# Patient Record
Sex: Female | Born: 1975 | Race: Asian | Hispanic: No | Smoking: Never smoker
Health system: Southern US, Community
[De-identification: ages and names within clinical notes are randomized; demographics above are authoritative.]

## PROBLEM LIST (undated history)

## (undated) DIAGNOSIS — K219 Gastro-esophageal reflux disease without esophagitis: Secondary | ICD-10-CM

## (undated) DIAGNOSIS — I10 Essential (primary) hypertension: Secondary | ICD-10-CM

## (undated) DIAGNOSIS — Z87442 Personal history of urinary calculi: Secondary | ICD-10-CM

## (undated) DIAGNOSIS — D649 Anemia, unspecified: Secondary | ICD-10-CM

## (undated) DIAGNOSIS — R519 Headache, unspecified: Secondary | ICD-10-CM

## (undated) DIAGNOSIS — E785 Hyperlipidemia, unspecified: Secondary | ICD-10-CM

## (undated) DIAGNOSIS — E119 Type 2 diabetes mellitus without complications: Secondary | ICD-10-CM

## (undated) DIAGNOSIS — Z973 Presence of spectacles and contact lenses: Secondary | ICD-10-CM

## (undated) DIAGNOSIS — K30 Functional dyspepsia: Secondary | ICD-10-CM

## (undated) HISTORY — DX: Functional dyspepsia: K30

## (undated) HISTORY — DX: Anemia, unspecified: D64.9

## (undated) HISTORY — DX: Essential (primary) hypertension: I10

## (undated) HISTORY — DX: Hyperlipidemia, unspecified: E78.5

## (undated) HISTORY — DX: Type 2 diabetes mellitus without complications: E11.9

---

## 2012-05-09 HISTORY — PX: BREAST BIOPSY: SHX20

## 2016-09-07 ENCOUNTER — Encounter (INDEPENDENT_AMBULATORY_CARE_PROVIDER_SITE_OTHER): Payer: Self-pay | Admitting: Physician Assistant

## 2016-09-07 ENCOUNTER — Ambulatory Visit (INDEPENDENT_AMBULATORY_CARE_PROVIDER_SITE_OTHER): Payer: Medicaid Other | Admitting: Physician Assistant

## 2016-09-07 VITALS — BP 127/76 | HR 77 | Temp 97.6°F | Ht 59.0 in | Wt 152.4 lb

## 2016-09-07 DIAGNOSIS — R202 Paresthesia of skin: Secondary | ICD-10-CM

## 2016-09-07 DIAGNOSIS — Z114 Encounter for screening for human immunodeficiency virus [HIV]: Secondary | ICD-10-CM

## 2016-09-07 DIAGNOSIS — E785 Hyperlipidemia, unspecified: Secondary | ICD-10-CM

## 2016-09-07 DIAGNOSIS — I10 Essential (primary) hypertension: Secondary | ICD-10-CM | POA: Diagnosis not present

## 2016-09-07 DIAGNOSIS — K30 Functional dyspepsia: Secondary | ICD-10-CM

## 2016-09-07 DIAGNOSIS — G4700 Insomnia, unspecified: Secondary | ICD-10-CM | POA: Diagnosis not present

## 2016-09-07 DIAGNOSIS — E119 Type 2 diabetes mellitus without complications: Secondary | ICD-10-CM

## 2016-09-07 DIAGNOSIS — R5383 Other fatigue: Secondary | ICD-10-CM | POA: Diagnosis not present

## 2016-09-07 HISTORY — DX: Functional dyspepsia: K30

## 2016-09-07 LAB — GLUCOSE, POCT (MANUAL RESULT ENTRY): POC GLUCOSE: 194 mg/dL — AB (ref 70–99)

## 2016-09-07 LAB — POCT GLYCOSYLATED HEMOGLOBIN (HGB A1C): Hemoglobin A1C: 7.5

## 2016-09-07 MED ORDER — SITAGLIPTIN PHOS-METFORMIN HCL 50-1000 MG PO TABS: 1.0000 | ORAL_TABLET | Freq: Two times a day (BID) | ORAL | 1 refills | Status: DC

## 2016-09-07 MED ORDER — VITAMIN B-12 100 MCG PO TABS: 100.0000 ug | ORAL_TABLET | Freq: Every day | ORAL | 0 refills | Status: DC

## 2016-09-07 MED ORDER — PANTOPRAZOLE SODIUM 40 MG PO TBEC: 40.0000 mg | DELAYED_RELEASE_TABLET | Freq: Every day | ORAL | 3 refills | Status: DC

## 2016-09-07 MED ORDER — VITAMIN D-3 125 MCG (5000 UT) PO TABS: 1.0000 | ORAL_TABLET | Freq: Every day | ORAL | 1 refills | Status: DC

## 2016-09-07 MED ORDER — MIRTAZAPINE 15 MG PO TABS: 15.0000 mg | ORAL_TABLET | Freq: Every day | ORAL | 0 refills | Status: DC

## 2016-09-07 NOTE — Progress Notes (Signed)
Subjective:  Patient ID: Katherine Barr, female    DOB: 1975/05/19  Age: 41 y.o. MRN: [id number]  CC: establish care  HPI Katherine Barr is a 41 y.o. female with a PMH of HTN, DM2, and hyperlipidemia presents to establish care for these conditions. Takes medications but does not remember the names or doses. Will call us when she gets home with med info. Also complains of fatigue, stress, acid indigestion, insomnia, and paresthesia of LEs at night only. Denies any other symptoms.   Review of Systems  Constitutional: Positive for malaise/fatigue. Negative for chills and fever.  Eyes: Negative for blurred vision.  Respiratory: Negative for shortness of breath.   Cardiovascular: Negative for chest pain and palpitations.  Gastrointestinal: Positive for abdominal pain. Negative for nausea.  Genitourinary: Negative for dysuria and hematuria.  Musculoskeletal: Negative for joint pain and myalgias.  Skin: Negative for rash.  Neurological: Positive for tingling. Negative for headaches.  Psychiatric/Behavioral: Negative for depression. The patient has insomnia. The patient is not nervous/anxious.        Stress    Objective:  BP 127/76 (BP Location: Left Arm, Patient Position: Sitting, Cuff Size: Normal)   Pulse 77   Temp 97.6 F (36.4 C) (Oral)   Ht  (1.499 m)   Wt 152 lb 6.4 oz (69.1 kg)   LMP 08/19/2016 (Exact Date)   SpO2 99%   BMI 30.78 kg/m   BP/Weight 09/07/2016  Systolic BP 127  Diastolic BP 76  Wt. (Lbs) 152.4  BMI 30.78      Physical Exam  Constitutional: She is oriented to person, place, and time.  Well developed, overweight, NAD, polite, assertive   HENT:  Head: Normocephalic and atraumatic.  Eyes: No scleral icterus.  Neck: Normal range of motion. Neck supple. No thyromegaly present.  Cardiovascular: Normal rate, regular rhythm and normal heart sounds.   Pulmonary/Chest: Effort normal and breath sounds normal.  Musculoskeletal: She exhibits no edema.   Neurological: She is alert and oriented to person, place, and time. No cranial nerve deficit. Coordination normal.  Skin: Skin is warm and dry. No rash noted. No erythema. No pallor.  Psychiatric: She has a normal mood and affect. Her behavior is normal. Thought content normal.  Vitals reviewed.    Assessment & Plan:   1. Fatigue, unspecified type - CBC with Differential - Comprehensive metabolic panel - TSH - Begin Cholecalciferol (VITAMIN D-3) 5000 units TABS; Take 1 tablet by mouth daily.  Dispense: 30 tablet; Refill: 1  2. Insomnia, unspecified type - Begin mirtazapine (REMERON) 15 MG tablet; Take 1 tablet (15 mg total) by mouth at bedtime.  Dispense: 30 tablet; Refill: 0  3. Type 2 diabetes mellitus without complication, without long-term current use of insulin (HCC) - Refill sitaGLIPtin-metformin (JANUMET) 50-1000 MG tablet; Take 1 tablet by mouth 2 (two) times daily with a meal.  Dispense: 180 tablet; Refill: 1 - HgB A1c 7.5% in clinic today - Glucose (CBG) 194 in clinic today - Microalbumin/Creatinine Ratio, Urine - Ambulatory referral to Ophthalmology   4. Hypertension, unspecified type - Pt will call with medication list  5. Hyperlipidemia, unspecified hyperlipidemia type - Lipid panel  6. Acid indigestion - Refill pantoprazole (PROTONIX) 40 MG tablet; Take 1 tablet (40 mg total) by mouth daily.  Dispense: 30 tablet; Refill: 3  7. Screening for HIV (human immunodeficiency virus) - HIV antibody  8. Paresthesia - Begin vitamin B-12 (CYANOCOBALAMIN) 100 MCG tablet; Take 1 tablet (100 mcg total) by mouth daily.  Dispense: 14 tablet; Refill: 0   Meds ordered this encounter  Medications  . sitaGLIPtin-metformin (JANUMET) 50-1000 MG tablet    Sig: Take 1 tablet by mouth 2 (two) times daily with a meal.    Dispense:  180 tablet    Refill:  1    Order Specific Question:   Supervising Provider    Answer:   Quentin Angst [id number]  . pantoprazole (PROTONIX)  40 MG tablet    Sig: Take 1 tablet (40 mg total) by mouth daily.    Dispense:  30 tablet    Refill:  3    Order Specific Question:   Supervising Provider    Answer:   Quentin Angst [id number]  . mirtazapine (REMERON) 15 MG tablet    Sig: Take 1 tablet (15 mg total) by mouth at bedtime.    Dispense:  30 tablet    Refill:  0    Order Specific Question:   Supervising Provider    Answer:   Quentin Angst [id number]  . vitamin B-12 (CYANOCOBALAMIN) 100 MCG tablet    Sig: Take 1 tablet (100 mcg total) by mouth daily.    Dispense:  14 tablet    Refill:  0    Order Specific Question:   Supervising Provider    Answer:   Quentin Angst [id number]  . Cholecalciferol (VITAMIN D-3) 5000 units TABS    Sig: Take 1 tablet by mouth daily.    Dispense:  30 tablet    Refill:  1    Order Specific Question:   Supervising Provider    Answer:   Quentin Angst [id number]    Follow-up: Return in about 4 weeks (around 10/05/2016) for insomnia, DM.   Loletta Specter PA

## 2016-09-07 NOTE — Patient Instructions (Addendum)
Please remember to call me with the medications that you take and their dosages.  Your A1c is 7.5% today. This is likely the highest you have had your sugar. I recommend a low carb diet and regular exercise for at least 30 minutes daily. Please read over the Diabetic Nutrition pamphlet I gave you.   Diabetes Mellitus and Food It is important for you to manage your blood sugar (glucose) level. Your blood glucose level can be greatly affected by what you eat. Eating healthier foods in the appropriate amounts throughout the day at about the same time each day will help you control your blood glucose level. It can also help slow or prevent worsening of your diabetes mellitus. Healthy eating may even help you improve the level of your blood pressure and reach or maintain a healthy weight. General recommendations for healthful eating and cooking habits include:  Eating meals and snacks regularly. Avoid going long periods of time without eating to lose weight.  Eating a diet that consists mainly of plant-based foods, such as fruits, vegetables, nuts, legumes, and whole grains.  Using low-heat cooking methods, such as baking, instead of high-heat cooking methods, such as deep frying. Work with your dietitian to make sure you understand how to use the Nutrition Facts information on food labels. How can food affect me? Carbohydrates  Carbohydrates affect your blood glucose level more than any other type of food. Your dietitian will help you determine how many carbohydrates to eat at each meal and teach you how to count carbohydrates. Counting carbohydrates is important to keep your blood glucose at a healthy level, especially if you are using insulin or taking certain medicines for diabetes mellitus. Alcohol  Alcohol can cause sudden decreases in blood glucose (hypoglycemia), especially if you use insulin or take certain medicines for diabetes mellitus. Hypoglycemia can be a life-threatening condition.  Symptoms of hypoglycemia (sleepiness, dizziness, and disorientation) are similar to symptoms of having too much alcohol. If your health care provider has given you approval to drink alcohol, do so in moderation and use the following guidelines:  Women should not have more than one drink per day, and men should not have more than two drinks per day. One drink is equal to:  12 oz of beer.  5 oz of wine.  1 oz of hard liquor.  Do not drink on an empty stomach.  Keep yourself hydrated. Have water, diet soda, or unsweetened iced tea.  Regular soda, juice, and other mixers might contain a lot of carbohydrates and should be counted. What foods are not recommended? As you make food choices, it is important to remember that all foods are not the same. Some foods have fewer nutrients per serving than other foods, even though they might have the same number of calories or carbohydrates. It is difficult to get your body what it needs when you eat foods with fewer nutrients. Examples of foods that you should avoid that are high in calories and carbohydrates but low in nutrients include:  Trans fats (most processed foods list trans fats on the Nutrition Facts label).  Regular soda.  Juice.  Candy.  Sweets, such as cake, pie, doughnuts, and cookies.  Fried foods. What foods can I eat? Eat nutrient-rich foods, which will nourish your body and keep you healthy. The food you should eat also will depend on several factors, including:  The calories you need.  The medicines you take.  Your weight.  Your blood glucose level.  Your blood  pressure level.  Your cholesterol level. You should eat a variety of foods, including:  Protein.  Lean cuts of meat.  Proteins low in saturated fats, such as fish, egg whites, and beans. Avoid processed meats.  Fruits and vegetables.  Fruits and vegetables that may help control blood glucose levels, such as apples, mangoes, and yams.  Dairy  products.  Choose fat-free or low-fat dairy products, such as milk, yogurt, and cheese.  Grains, bread, pasta, and rice.  Choose whole grain products, such as multigrain bread, whole oats, and brown rice. These foods may help control blood pressure.  Fats.  Foods containing healthful fats, such as nuts, avocado, olive oil, canola oil, and fish. Does everyone with diabetes mellitus have the same meal plan? Because every person with diabetes mellitus is different, there is not one meal plan that works for everyone. It is very important that you meet with a dietitian who will help you create a meal plan that is just right for you. This information is not intended to replace advice given to you by your health care provider. Make sure you discuss any questions you have with your health care provider. Document Released: 01/20/2005 Document Revised: 10/01/2015 Document Reviewed: 03/22/2013 Elsevier Interactive Patient Education  2017 [employer].

## 2016-09-08 LAB — CBC WITH DIFFERENTIAL/PLATELET
BASOS ABS: 0 10*3/uL (ref 0.0–0.2)
Basos: 0 %
EOS (ABSOLUTE): 0.2 10*3/uL (ref 0.0–0.4)
Eos: 2 %
Hematocrit: 28 % — ABNORMAL LOW (ref 34.0–46.6)
Hemoglobin: 8.1 g/dL — ABNORMAL LOW (ref 11.1–15.9)
IMMATURE GRANS (ABS): 0.1 10*3/uL (ref 0.0–0.1)
IMMATURE GRANULOCYTES: 1 %
LYMPHS: 33 %
Lymphocytes Absolute: 3.3 10*3/uL — ABNORMAL HIGH (ref 0.7–3.1)
MCH: 19.4 pg — AB (ref 26.6–33.0)
MCHC: 28.9 g/dL — ABNORMAL LOW (ref 31.5–35.7)
MCV: 67 fL — ABNORMAL LOW (ref 79–97)
Monocytes Absolute: 0.4 10*3/uL (ref 0.1–0.9)
Monocytes: 4 %
NEUTROS PCT: 60 %
Neutrophils Absolute: 6.1 10*3/uL (ref 1.4–7.0)
PLATELETS: 386 10*3/uL — AB (ref 150–379)
RBC: 4.17 x10E6/uL (ref 3.77–5.28)
RDW: 17.8 % — ABNORMAL HIGH (ref 12.3–15.4)
WBC: 10.1 10*3/uL (ref 3.4–10.8)

## 2016-09-08 LAB — HIV ANTIBODY (ROUTINE TESTING W REFLEX): HIV SCREEN 4TH GENERATION: NONREACTIVE

## 2016-09-08 LAB — LIPID PANEL
Chol/HDL Ratio: 3.5 ratio (ref 0.0–4.4)
Cholesterol, Total: 117 mg/dL (ref 100–199)
HDL: 33 mg/dL — ABNORMAL LOW (ref >39)
LDL Calculated: 31 mg/dL (ref 0–99)
Triglycerides: 265 mg/dL — ABNORMAL HIGH (ref 0–149)
VLDL CHOLESTEROL CAL: 53 mg/dL — AB (ref 5–40)

## 2016-09-08 LAB — COMPREHENSIVE METABOLIC PANEL
ALK PHOS: 84 IU/L (ref 39–117)
ALT: 14 IU/L (ref 0–32)
AST: 13 IU/L (ref 0–40)
Albumin/Globulin Ratio: 1.3 (ref 1.2–2.2)
Albumin: 4 g/dL (ref 3.5–5.5)
BUN / CREAT RATIO: 15 (ref 9–23)
BUN: 9 mg/dL (ref 6–24)
CALCIUM: 9.2 mg/dL (ref 8.7–10.2)
CHLORIDE: 104 mmol/L (ref 96–106)
CO2: 18 mmol/L (ref 18–29)
Creatinine, Ser: 0.59 mg/dL (ref 0.57–1.00)
GFR calc Af Amer: 133 mL/min/{1.73_m2} (ref >59)
GFR calc non Af Amer: 115 mL/min/{1.73_m2} (ref >59)
GLUCOSE: 201 mg/dL — AB (ref 65–99)
Globulin, Total: 3.1 g/dL (ref 1.5–4.5)
Potassium: 4.3 mmol/L (ref 3.5–5.2)
Sodium: 140 mmol/L (ref 134–144)
TOTAL PROTEIN: 7.1 g/dL (ref 6.0–8.5)

## 2016-09-08 LAB — TSH: TSH: 2.22 u[IU]/mL (ref 0.450–4.500)

## 2016-09-08 LAB — MICROALBUMIN / CREATININE URINE RATIO
CREATININE, UR: 41.4 mg/dL
MICROALB/CREAT RATIO: 15.2 mg/g{creat} (ref 0.0–30.0)
MICROALBUM., U, RANDOM: 6.3 ug/mL

## 2016-09-21 ENCOUNTER — Encounter (INDEPENDENT_AMBULATORY_CARE_PROVIDER_SITE_OTHER): Payer: Self-pay | Admitting: Physician Assistant

## 2016-09-21 ENCOUNTER — Ambulatory Visit (INDEPENDENT_AMBULATORY_CARE_PROVIDER_SITE_OTHER): Payer: Medicaid Other | Admitting: Physician Assistant

## 2016-09-21 VITALS — BP 128/82 | HR 83 | Temp 97.7°F | Wt 152.4 lb

## 2016-09-21 DIAGNOSIS — E781 Pure hyperglyceridemia: Secondary | ICD-10-CM

## 2016-09-21 DIAGNOSIS — D649 Anemia, unspecified: Secondary | ICD-10-CM

## 2016-09-21 DIAGNOSIS — R059 Cough, unspecified: Secondary | ICD-10-CM

## 2016-09-21 DIAGNOSIS — R05 Cough: Secondary | ICD-10-CM

## 2016-09-21 MED ORDER — FERROUS SULFATE 325 (65 FE) MG PO TABS
325.0000 mg | ORAL_TABLET | Freq: Every day | ORAL | 1 refills | Status: DC
Start: 2016-09-21 — End: 2017-11-26

## 2016-09-21 MED ORDER — MEGARED OMEGA-3 KRILL OIL 500 MG PO CAPS: 1.0000 | ORAL_CAPSULE | Freq: Every day | ORAL | 5 refills | Status: AC

## 2016-09-21 MED ORDER — LORATADINE 10 MG PO TABS
10.0000 mg | ORAL_TABLET | Freq: Every day | ORAL | 11 refills | Status: DC
Start: 2016-09-21 — End: 2017-11-26

## 2016-09-21 MED ORDER — DEXTROMETHORPHAN POLISTIREX ER 30 MG/5ML PO SUER: 60.0000 mg | Freq: Two times a day (BID) | ORAL | 0 refills | Status: DC

## 2016-09-21 NOTE — Patient Instructions (Signed)
Anemia, Nonspecific Anemia is a condition in which the concentration of red blood cells or hemoglobin in the blood is below normal. Hemoglobin is a substance in red blood cells that carries oxygen to the tissues of the body. Anemia results in not enough oxygen reaching these tissues. What are the causes? Common causes of anemia include:  Excessive bleeding. Bleeding may be internal or external. This includes excessive bleeding from periods (in women) or from the intestine.  Poor nutrition.  Chronic kidney, thyroid, and liver disease.  Bone marrow disorders that decrease red blood cell production.  Cancer and treatments for cancer.  HIV, AIDS, and their treatments.  Spleen problems that increase red blood cell destruction.  Blood disorders.  Excess destruction of red blood cells due to infection, medicines, and autoimmune disorders. What are the signs or symptoms?  Minor weakness.  Dizziness.  Headache.  Palpitations.  Shortness of breath, especially with exercise.  Paleness.  Cold sensitivity.  Indigestion.  Nausea.  Difficulty sleeping.  Difficulty concentrating. Symptoms may occur suddenly or they may develop slowly. How is this diagnosed? Additional blood tests are often needed. These help your health care provider determine the best treatment. Your health care provider will check your stool for blood and look for other causes of blood loss. How is this treated? Treatment varies depending on the cause of the anemia. Treatment can include:  Supplements of iron, vitamin B12, or folic acid.  Hormone medicines.  A blood transfusion. This may be needed if blood loss is severe.  Hospitalization. This may be needed if there is significant continual blood loss.  Dietary changes.  Spleen removal. Follow these instructions at home: Keep all follow-up appointments. It often takes many weeks to correct anemia, and having your health care provider check on your  condition and your response to treatment is very important. Get help right away if:  You develop extreme weakness, shortness of breath, or chest pain.  You become dizzy or have trouble concentrating.  You develop heavy vaginal bleeding.  You develop a rash.  You have bloody or black, tarry stools.  You faint.  You vomit up blood.  You vomit repeatedly.  You have abdominal pain.  You have a fever or persistent symptoms for more than 2-3 days.  You have a fever and your symptoms suddenly get worse.  You are dehydrated. This information is not intended to replace advice given to you by your health care provider. Make sure you discuss any questions you have with your health care provider. Document Released: 06/02/2004 Document Revised: 10/07/2015 Document Reviewed: 10/19/2012 Elsevier Interactive Patient Education  2017 [employer] Inc[employer]  

## 2016-09-21 NOTE — Progress Notes (Signed)
Subjective:  Patient ID: Katherine Barr, female    DOB: 1975/08/14  Age: 41 y.o. MRN: [id number]  CC: cough  HPI Katherine Barr is a 41 y.o. female with multiple comorbidities presents to f/u on labs and has a complaint of cough. Cough appears every year in May and lasts approximately one month. Wants a cough suppressant. Denies having seasonal allergies. Labs indicated anemia and hypertriglyceridemia. Pt endorses fatigue. Does not endorse any other symptoms.     Outpatient Medications Prior to Visit  Medication Sig Dispense Refill  . Cholecalciferol (VITAMIN D-3) 5000 units TABS Take 1 tablet by mouth daily. 30 tablet 1  . mirtazapine (REMERON) 15 MG tablet Take 1 tablet (15 mg total) by mouth at bedtime. 30 tablet 0  . pantoprazole (PROTONIX) 40 MG tablet Take 1 tablet (40 mg total) by mouth daily. 30 tablet 3  . sitaGLIPtin-metformin (JANUMET) 50-1000 MG tablet Take 1 tablet by mouth 2 (two) times daily with a meal. 180 tablet 1  . vitamin B-12 (CYANOCOBALAMIN) 100 MCG tablet Take 1 tablet (100 mcg total) by mouth daily. 14 tablet 0   No facility-administered medications prior to visit.      ROS Review of Systems  Constitutional: Positive for malaise/fatigue. Negative for chills and fever.  Eyes: Negative for blurred vision.  Respiratory: Negative for shortness of breath.   Cardiovascular: Negative for chest pain and palpitations.  Gastrointestinal: Negative for abdominal pain and nausea.  Genitourinary: Negative for dysuria and hematuria.  Musculoskeletal: Negative for joint pain and myalgias.  Skin: Negative for rash.  Neurological: Negative for tingling and headaches.  Psychiatric/Behavioral: Negative for depression. The patient is not nervous/anxious.     Objective:  BP 128/82 (BP Location: Left Arm, Patient Position: Sitting, Cuff Size: Normal)   Pulse 83   Temp 97.7 F (36.5 C) (Oral)   Wt 152 lb 6.4 oz (69.1 kg)   LMP 09/15/2016 (Exact Date)   SpO2 97%    BMI 30.78 kg/m   BP/Weight 09/21/2016 09/07/2016  Systolic BP 128 127  Diastolic BP 82 76  Wt. (Lbs) 152.4 152.4  BMI 30.78 30.78      Physical Exam  Constitutional: She is oriented to person, place, and time.  Well developed, overweight, NAD, polite  HENT:  Head: Normocephalic and atraumatic.  Eyes: No scleral icterus.  Cardiovascular: Normal rate, regular rhythm and normal heart sounds.   Pulmonary/Chest: Effort normal and breath sounds normal.  Neurological: She is alert and oriented to person, place, and time.  Skin: Skin is warm and dry. No rash noted. No erythema. No pallor.  Psychiatric: She has a normal mood and affect. Her behavior is normal. Thought content normal.  Vitals reviewed.    Assessment & Plan:   1. Anemia, unspecified type - Iron and TIBC - Ferritin - Hemoglobinopathy evaluation - Begin ferrous sulfate 325 (65 FE) MG tablet; Take 1 tablet (325 mg total) by mouth daily with breakfast.  Dispense: 60 tablet; Refill: 1  2. Hypertriglyceridemia - Begin MEGARED OMEGA-3 KRILL OIL 500 MG CAPS; Take 1 capsule by mouth daily.  Dispense: 30 capsule; Refill: 5  3. Cough - Begin loratadine (CLARITIN) 10 MG tablet; Take 1 tablet (10 mg total) by mouth daily.  Dispense: 30 tablet; Refill: 11 - Begin dextromethorphan (DELSYM) 30 MG/5ML liquid; Take 10 mLs (60 mg total) by mouth 2 (two) times daily.  Dispense: 148 mL; Refill: 0 - TB Skin Test   Meds ordered this encounter  Medications  . MEGARED OMEGA-3  KRILL OIL 500 MG CAPS    Sig: Take 1 capsule by mouth daily.    Dispense:  30 capsule    Refill:  5    Order Specific Question:   Supervising Provider    Answer:   Quentin AngstJEGEDE, OLUGBEMIGA E [id number]]  . ferrous sulfate 325 (65 FE) MG tablet    Sig: Take 1 tablet (325 mg total) by mouth daily with breakfast.    Dispense:  60 tablet    Refill:  1    Order Specific Question:   Supervising Provider    Answer:   Quentin AngstJEGEDE, OLUGBEMIGA E [id number]  . loratadine (CLARITIN)  10 MG tablet    Sig: Take 1 tablet (10 mg total) by mouth daily.    Dispense:  30 tablet    Refill:  11    Order Specific Question:   Supervising Provider    Answer:   Quentin AngstJEGEDE, OLUGBEMIGA E [id number]  . dextromethorphan (DELSYM) 30 MG/5ML liquid    Sig: Take 10 mLs (60 mg total) by mouth 2 (two) times daily.    Dispense:  148 mL    Refill:  0    Order Specific Question:   Supervising Provider    Answer:   Quentin AngstJEGEDE, OLUGBEMIGA E [[id number]]    Follow-up: Return in about 4 weeks (around 10/19/2016), or if symptoms worsen or fail to improve, for cough, anemia.   Loletta Specteroger David Markeda Narvaez PA

## 2016-09-22 ENCOUNTER — Other Ambulatory Visit (INDEPENDENT_AMBULATORY_CARE_PROVIDER_SITE_OTHER): Payer: Self-pay | Admitting: Physician Assistant

## 2016-09-22 DIAGNOSIS — E119 Type 2 diabetes mellitus without complications: Secondary | ICD-10-CM

## 2016-09-22 LAB — HEMOGLOBINOPATHY EVALUATION
HGB A: 98.3 % (ref 96.4–98.8)
HGB C: 0 %
HGB S: 0 %
HGB VARIANT: 0 %
Hemoglobin A2 Quantitation: 1.7 % — ABNORMAL LOW (ref 1.8–3.2)
Hemoglobin F Quantitation: 0 % (ref 0.0–2.0)

## 2016-09-22 LAB — IRON AND TIBC
IRON SATURATION: 3 % — AB (ref 15–55)
IRON: 14 ug/dL — AB (ref 27–159)
Total Iron Binding Capacity: 423 ug/dL (ref 250–450)
UIBC: 409 ug/dL (ref 131–425)

## 2016-09-22 LAB — FERRITIN: FERRITIN: 6 ng/mL — AB (ref 15–150)

## 2016-09-22 MED ORDER — SITAGLIPTIN PHOS-METFORMIN HCL 50-1000 MG PO TABS: 1.0000 | ORAL_TABLET | Freq: Two times a day (BID) | ORAL | 11 refills | Status: DC

## 2016-09-22 NOTE — Progress Notes (Signed)
I called and obtained PA for Janumet.

## 2016-09-23 ENCOUNTER — Telehealth (INDEPENDENT_AMBULATORY_CARE_PROVIDER_SITE_OTHER): Payer: Self-pay | Admitting: Physician Assistant

## 2016-09-23 NOTE — Telephone Encounter (Signed)
Patient called left voicemail stated [facility]  Per patient she already discussed referral with Sindy Messingoger Gomez PA  Please follow up with patient.

## 2016-09-23 NOTE — Telephone Encounter (Signed)
FWD to PCP. Apryl Brymer S Marit Goodwill, CMA  

## 2016-09-23 NOTE — Telephone Encounter (Signed)
Fwd to pcp. Tempestt S Roberts, CMA   

## 2016-09-23 NOTE — Telephone Encounter (Signed)
[facility] called stated checking on status of PA for medication Janumet  Please call pharm back

## 2016-09-24 NOTE — Telephone Encounter (Signed)
Ambulatory referral to ophthalmology has already been done on 09/07/16. Patient has never told me where she wants to go. Please see if the referral department can accommodate her request.

## 2016-09-24 NOTE — Telephone Encounter (Signed)
I called pharmacy. I told them that I have already done the PA one day prior. Pharmacist looked and said they had two orders and it seems patient has picked up her Janumet yesterday.

## 2016-09-29 ENCOUNTER — Telehealth (INDEPENDENT_AMBULATORY_CARE_PROVIDER_SITE_OTHER): Payer: Self-pay | Admitting: Physician Assistant

## 2016-09-29 NOTE — Telephone Encounter (Signed)
Fwd to PCP. Deran Barro S Dontea Corlew, CMA  

## 2016-09-29 NOTE — Telephone Encounter (Signed)
Per patient did not get lipitor 10mg   And Propranolol 80 mg. [id number] [place] [address] [facility] [address] DEA #:  [id number]

## 2016-09-30 ENCOUNTER — Other Ambulatory Visit (INDEPENDENT_AMBULATORY_CARE_PROVIDER_SITE_OTHER): Payer: Self-pay | Admitting: Physician Assistant

## 2016-09-30 DIAGNOSIS — Z760 Encounter for issue of repeat prescription: Secondary | ICD-10-CM

## 2016-09-30 MED ORDER — ATORVASTATIN CALCIUM 10 MG PO TABS: 10.0000 mg | ORAL_TABLET | Freq: Every day | ORAL | 1 refills | Status: DC

## 2016-09-30 MED ORDER — PROPRANOLOL HCL 80 MG PO TABS: 80.0000 mg | ORAL_TABLET | Freq: Three times a day (TID) | ORAL | 1 refills | Status: DC

## 2016-09-30 NOTE — Progress Notes (Signed)
Refill request

## 2016-10-05 ENCOUNTER — Ambulatory Visit (INDEPENDENT_AMBULATORY_CARE_PROVIDER_SITE_OTHER): Payer: Medicaid Other | Admitting: Physician Assistant

## 2016-10-24 ENCOUNTER — Encounter (INDEPENDENT_AMBULATORY_CARE_PROVIDER_SITE_OTHER): Payer: Self-pay | Admitting: Physician Assistant

## 2016-10-24 ENCOUNTER — Ambulatory Visit (INDEPENDENT_AMBULATORY_CARE_PROVIDER_SITE_OTHER): Payer: Medicaid Other | Admitting: Physician Assistant

## 2016-10-24 VITALS — BP 131/81 | HR 78 | Temp 98.2°F | Wt 153.6 lb

## 2016-10-24 DIAGNOSIS — R202 Paresthesia of skin: Secondary | ICD-10-CM

## 2016-10-24 DIAGNOSIS — D649 Anemia, unspecified: Secondary | ICD-10-CM | POA: Diagnosis not present

## 2016-10-24 DIAGNOSIS — G4700 Insomnia, unspecified: Secondary | ICD-10-CM | POA: Diagnosis not present

## 2016-10-24 DIAGNOSIS — E119 Type 2 diabetes mellitus without complications: Secondary | ICD-10-CM

## 2016-10-24 HISTORY — DX: Anemia, unspecified: D64.9

## 2016-10-24 MED ORDER — MIRTAZAPINE 15 MG PO TABS: 7.5000 mg | ORAL_TABLET | Freq: Every day | ORAL | 3 refills | Status: DC

## 2016-10-24 NOTE — Progress Notes (Signed)
1. Fatigue, unspecified type - CBC with Differential - Comprehensive metabolic panel - TSH - Begin Cholecalciferol (VITAMIN D-3) 5000 units TABS; Take 1 tablet by mouth daily.  Dispense: 30 tablet; Refill: 1  2. Insomnia, unspecified type - Begin mirtazapine (REMERON) 15 MG tablet; Take 1 tablet (15 mg total) by mouth at bedtime.  Dispense: 30 tablet; Refill: 0    Subjective:  Patient ID: Katherine Barr, female    DOB: 1975/09/13  Age: 41 y.o. MRN: [id number]  CC: f/u insomnia  HPI Katherine SmokerSejalbahan N Napoli is a 41 y.o. female with a PMH of anemia, DM2, HTN, and insomnia presents to f/u for DM2 and insomnia. Says mirtazapine 15mg  has helped her sleep. Sleeps up to 10:00 am when she takes mirtazapine. No side effects reported.    In regards to DM2, she feels well. Does not take her glucose readings daily because she has lost her glucometer. She would like to try an find it before having to buy another one. Last A1c 7.5% on 09/07/16.     Outpatient Medications Prior to Visit  Medication Sig Dispense Refill  . atorvastatin (LIPITOR) 10 MG tablet Take 1 tablet (10 mg total) by mouth daily. 90 tablet 1  . Cholecalciferol (VITAMIN D-3) 5000 units TABS Take 1 tablet by mouth daily. 30 tablet 1  . dextromethorphan (DELSYM) 30 MG/5ML liquid Take 10 mLs (60 mg total) by mouth 2 (two) times daily. 148 mL 0  . ferrous sulfate 325 (65 FE) MG tablet Take 1 tablet (325 mg total) by mouth daily with breakfast. 60 tablet 1  . loratadine (CLARITIN) 10 MG tablet Take 1 tablet (10 mg total) by mouth daily. 30 tablet 11  . MEGARED OMEGA-3 KRILL OIL 500 MG CAPS Take 1 capsule by mouth daily. 30 capsule 5  . montelukast (SINGULAIR) 10 MG tablet Take 10 mg by mouth at bedtime.    . pantoprazole (PROTONIX) 40 MG tablet Take 1 tablet (40 mg total) by mouth daily. 30 tablet 3  . propranolol (INDERAL) 80 MG tablet Take 1 tablet (80 mg total) by mouth 3 (three) times daily. 270 tablet 1  . sitaGLIPtin-metformin  (JANUMET) 50-1000 MG tablet Take 1 tablet by mouth 2 (two) times daily with a meal. 60 tablet 11  . vitamin B-12 (CYANOCOBALAMIN) 100 MCG tablet Take 1 tablet (100 mcg total) by mouth daily. 14 tablet 0  . mirtazapine (REMERON) 15 MG tablet Take 1 tablet (15 mg total) by mouth at bedtime. 30 tablet 0   No facility-administered medications prior to visit.      ROS ROS  Objective:  BP 131/81 (BP Location: Left Arm, Patient Position: Sitting, Cuff Size: Normal)   Pulse 78   Temp 98.2 F (36.8 C) (Oral)   Wt 153 lb 9.6 oz (69.7 kg)   LMP 09/21/2016 (Approximate)   SpO2 98%   BMI 31.02 kg/m   BP/Weight 10/24/2016 09/21/2016 09/07/2016  Systolic BP 131 128 127  Diastolic BP 81 82 76  Wt. (Lbs) 153.6 152.4 152.4  BMI 31.02 30.78 30.78      Physical Exam   Assessment & Plan:   1. Insomnia, unspecified type - Decrease mirtazapine (REMERON) 15 MG tablet; Take 0.5 tablets (7.5 mg total) by mouth at bedtime.  Dispense: 45 tablet; Refill: 3  2. Anemia, unspecified type - CBC with Differential  3. Type 2 diabetes mellitus without complication, without long-term current use of insulin (HCC) - PR DIABETIC CUSTOM MOLDED SHOE - Referral to orthotics - Referral to  podiatry - We have faxed the referral order to [facility] so that patient can have her diabetic retinopathy screening done. - Last A1c 7.5% on 09/07/16.  4. Paresthesia - Resolved with use of Vit B12. Perhaps also with glucose control but glucometer readings are not available.   Meds ordered this encounter  Medications  . mirtazapine (REMERON) 15 MG tablet    Sig: Take 0.5 tablets (7.5 mg total) by mouth at bedtime.    Dispense:  45 tablet    Refill:  3    Order Specific Question:   Supervising Provider    Answer:   Quentin Angst [id number]    Follow-up: Return in about 8 weeks (around 12/19/2016) for DM2 for A1c.Loletta Specter PA

## 2016-10-24 NOTE — Patient Instructions (Signed)
Iron Deficiency Anemia, Adult Iron deficiency anemia is a condition in which the concentration of red blood cells or hemoglobin in the blood is below normal because of too little iron. Hemoglobin is a substance in red blood cells that carries oxygen to the body's tissues. When the concentration of red blood cells or hemoglobin is too low, not enough oxygen reaches these tissues. Iron deficiency anemia is usually long-lasting (chronic) and it develops over time. It may or may not cause symptoms. It is a common type of anemia. What are the causes? This condition may be caused by:  Not enough iron in the diet.  Blood loss caused by bleeding in the intestine.  Blood loss from a gastrointestinal condition like Crohn disease.  Frequent blood draws, such as from blood donation.  Abnormal absorption in the gut.  Heavy menstrual periods in women.  Cancers of the gastrointestinal system, such as colon cancer.  What are the signs or symptoms? Symptoms of this condition may include:  Fatigue.  Headache.  Pale skin, lips, and nail beds.  Poor appetite.  Weakness.  Shortness of breath.  Dizziness.  Cold hands and feet.  Fast or irregular heartbeat.  Irritability. This is more common in severe anemia.  Rapid breathing. This is more common in severe anemia.  Mild anemia may not cause any symptoms. How is this diagnosed? This condition is diagnosed based on:  Your medical history.  A physical exam.  Blood tests.  You may have additional tests to find the underlying cause of your anemia, such as:  Testing for blood in the stool (fecal occult blood test).  A procedure to see inside your colon and rectum (colonoscopy).  A procedure to see inside your esophagus and stomach (endoscopy).  A test in which cells are removed from bone marrow (bone marrow aspiration) or fluid is removed from the bone marrow to be examined (biopsy). This is rarely needed.  How is this  treated? This condition is treated by correcting the cause of your iron deficiency. Treatment may involve:  Adding iron-rich foods to your diet.  Taking iron supplements. If you are pregnant or breastfeeding, you may need to take extra iron because your normal diet usually does not provide the amount of iron that you need.  Increasing vitamin C intake. Vitamin C helps your body absorb iron. Your health care provider may recommend that you take iron supplements along with a glass of orange juice or a vitamin C supplement.  Medicines to make heavy menstrual flow lighter.  Surgery.  You may need repeat blood tests to determine whether treatment is working. Depending on the underlying cause, the anemia should be corrected within 2 months of starting treatment. If the treatment does not seem to be working, you may need more testing. Follow these instructions at home: Medicines  Take over-the-counter and prescription medicines only as told by your health care provider. This includes iron supplements and vitamins.  If you cannot tolerate taking iron supplements by mouth, talk with your health care provider about taking them through a vein (intravenously) or an injection into a muscle.  For the best iron absorption, you should take iron supplements when your stomach is empty. If you cannot tolerate them on an empty stomach, you may need to take them with food.  Do not drink milk or take antacids at the same time as your iron supplements. Milk and antacids may interfere with iron absorption.  Iron supplements can cause constipation. To prevent constipation, include fiber   in your diet as told by your health care provider. A stool softener may also be recommended. Eating and drinking  Talk with your health care provider before changing your diet. He or she may recommend that you eat foods that contain a lot of iron, such as: ? Liver. ? Low-fat (lean) beef. ? Breads and cereals that have iron  added to them (are fortified). ? Eggs. ? Dried fruit. ? Dark green, leafy vegetables.  To help your body use the iron from iron-rich foods, eat those foods at the same time as fresh fruits and vegetables that are high in vitamin C. Foods that are high in vitamin C include: ? Oranges. ? Peppers. ? Tomatoes. ? Mangoes.  Drinkenoughfluid to keep your urine clear or pale yellow. General instructions  Return to your normal activities as told by your health care provider. Ask your health care provider what activities are safe for you.  Practice good hygiene. Anemia can make you more prone to illness and infection.  Keep all follow-up visits as told by your health care provider. This is important. Contact a health care provider if:  You feel nauseous or you vomit.  You feel weak.  You have unexplained sweating.  You develop symptoms of constipation, such as: ? Having fewer than three bowel movements a week. ? Straining to have a bowel movement. ? Having stools that are hard, dry, or larger than normal. ? Feeling full or bloated. ? Pain in the lower abdomen. ? Not feeling relief after having a bowel movement. Get help right away if:  You faint. If this happens, do not drive yourself to the hospital. Call your local emergency services (911 in the [place]  You have chest pain.  You have shortness of breath that: ? Is severe. ? Gets worse with physical activity.  You have a rapid heartbeat.  You become light-headed when getting up from a sitting or lying down position. This information is not intended to replace advice given to you by your health care provider. Make sure you discuss any questions you have with your health care provider. Document Released: 04/22/2000 Document Revised: 01/13/2016 Document Reviewed: 01/13/2016 Elsevier Interactive Patient Education  2018 [employer]  

## 2016-10-25 LAB — CBC WITH DIFFERENTIAL/PLATELET
BASOS: 0 %
Basophils Absolute: 0 10*3/uL (ref 0.0–0.2)
EOS (ABSOLUTE): 0.2 10*3/uL (ref 0.0–0.4)
EOS: 3 %
Hematocrit: 30.3 % — ABNORMAL LOW (ref 34.0–46.6)
Hemoglobin: 9.3 g/dL — ABNORMAL LOW (ref 11.1–15.9)
IMMATURE GRANS (ABS): 0 10*3/uL (ref 0.0–0.1)
IMMATURE GRANULOCYTES: 0 %
LYMPHS: 32 %
Lymphocytes Absolute: 2.9 10*3/uL (ref 0.7–3.1)
MCH: 21.5 pg — ABNORMAL LOW (ref 26.6–33.0)
MCHC: 30.7 g/dL — ABNORMAL LOW (ref 31.5–35.7)
MCV: 70 fL — ABNORMAL LOW (ref 79–97)
MONOCYTES: 4 %
Monocytes Absolute: 0.4 10*3/uL (ref 0.1–0.9)
NEUTROS PCT: 61 %
Neutrophils Absolute: 5.6 10*3/uL (ref 1.4–7.0)
Platelets: 342 10*3/uL (ref 150–379)
RBC: 4.32 x10E6/uL (ref 3.77–5.28)
RDW: 22.7 % — ABNORMAL HIGH (ref 12.3–15.4)
WBC: 9.2 10*3/uL (ref 3.4–10.8)

## 2016-11-17 ENCOUNTER — Encounter (INDEPENDENT_AMBULATORY_CARE_PROVIDER_SITE_OTHER): Payer: Self-pay | Admitting: Physician Assistant

## 2016-11-17 ENCOUNTER — Ambulatory Visit (INDEPENDENT_AMBULATORY_CARE_PROVIDER_SITE_OTHER): Payer: Medicaid Other | Admitting: Physician Assistant

## 2016-11-17 VITALS — BP 178/103 | HR 65 | Temp 97.7°F | Wt 149.8 lb

## 2016-11-17 DIAGNOSIS — J029 Acute pharyngitis, unspecified: Secondary | ICD-10-CM

## 2016-11-17 DIAGNOSIS — I10 Essential (primary) hypertension: Secondary | ICD-10-CM

## 2016-11-17 MED ORDER — AMOXICILLIN-POT CLAVULANATE 875-125 MG PO TABS: 1.0000 | ORAL_TABLET | Freq: Two times a day (BID) | ORAL | 0 refills | Status: AC

## 2016-11-17 MED ORDER — NAPROXEN 500 MG PO TABS: 500.0000 mg | ORAL_TABLET | Freq: Two times a day (BID) | ORAL | 0 refills | Status: DC

## 2016-11-17 NOTE — Progress Notes (Signed)
Subjective:  Patient ID: Katherine Barr, female    DOB: March 29, 1976  Age: 41 y.o. MRN: [id number]  CC: sore throat  HPI Katherine Barr is a 41 y.o. female with a PMH of HTN, HLD, DM2, anemia, and GERD presents with sore throat. Onset of sore throat since approximately 5 days ago. She has been in the hospital with her mother in law which ultimately died of renal and respiratory failure. Does not endorse odynophagia, drooling, difficulty with speech, swelling, f/c/n/v, headache, nuchal rigidity, facial pain, chest pain, SOB, or rash.    Outpatient Medications Prior to Visit  Medication Sig Dispense Refill  . atorvastatin (LIPITOR) 10 MG tablet Take 1 tablet (10 mg total) by mouth daily. 90 tablet 1  . Cholecalciferol (VITAMIN D-3) 5000 units TABS Take 1 tablet by mouth daily. 30 tablet 1  . dextromethorphan (DELSYM) 30 MG/5ML liquid Take 10 mLs (60 mg total) by mouth 2 (two) times daily. 148 mL 0  . ferrous sulfate 325 (65 FE) MG tablet Take 1 tablet (325 mg total) by mouth daily with breakfast. 60 tablet 1  . loratadine (CLARITIN) 10 MG tablet Take 1 tablet (10 mg total) by mouth daily. 30 tablet 11  . MEGARED OMEGA-3 KRILL OIL 500 MG CAPS Take 1 capsule by mouth daily. 30 capsule 5  . mirtazapine (REMERON) 15 MG tablet Take 0.5 tablets (7.5 mg total) by mouth at bedtime. 45 tablet 3  . montelukast (SINGULAIR) 10 MG tablet Take 10 mg by mouth at bedtime.    . pantoprazole (PROTONIX) 40 MG tablet Take 1 tablet (40 mg total) by mouth daily. 30 tablet 3  . propranolol (INDERAL) 80 MG tablet Take 1 tablet (80 mg total) by mouth 3 (three) times daily. 270 tablet 1  . sitaGLIPtin-metformin (JANUMET) 50-1000 MG tablet Take 1 tablet by mouth 2 (two) times daily with a meal. 60 tablet 11  . vitamin B-12 (CYANOCOBALAMIN) 100 MCG tablet Take 1 tablet (100 mcg total) by mouth daily. 14 tablet 0   No facility-administered medications prior to visit.      ROS Review of Systems  Constitutional:  Negative for chills, fever and malaise/fatigue.  HENT: Positive for sore throat. Negative for congestion, ear pain and sinus pain.   Eyes: Negative for blurred vision and pain.  Respiratory: Negative for shortness of breath.   Cardiovascular: Negative for chest pain and palpitations.  Gastrointestinal: Negative for abdominal pain and nausea.  Genitourinary: Negative for dysuria and hematuria.  Musculoskeletal: Negative for joint pain and myalgias.  Skin: Negative for rash.  Neurological: Negative for tingling and headaches.  Psychiatric/Behavioral: Negative for depression. The patient is not nervous/anxious.     Objective:  BP (!) 178/103 (BP Location: Left Arm, Patient Position: Sitting, Cuff Size: Normal)   Pulse 65   Temp 97.7 F (36.5 C) (Oral)   Wt 149 lb 12.8 oz (67.9 kg)   LMP 11/02/2016 (Approximate)   SpO2 100%   BMI 30.26 kg/m   BP/Weight 11/17/2016 10/24/2016 09/21/2016  Systolic BP 178 131 128  Diastolic BP 103 81 82  Wt. (Lbs) 149.8 153.6 152.4  BMI 30.26 31.02 30.78      Physical Exam  Constitutional: She is oriented to person, place, and time.  Well developed, well nourished, NAD, polite  HENT:  Head: Normocephalic and atraumatic.  No maxillary or frontal sinus TTP. Oropharynx mild to moderately erythematous without exudative lesions. Tonsils 1+.  Eyes: Conjunctivae are normal. No scleral icterus.  Neck: Normal range of motion.  Neck supple. No thyromegaly present.  Cardiovascular: Normal rate, regular rhythm and normal heart sounds.   Pulmonary/Chest: Effort normal and breath sounds normal.  Abdominal: Soft. Bowel sounds are normal. There is no tenderness.  Musculoskeletal: She exhibits no edema.  Lymphadenopathy:    She has no cervical adenopathy.  Neurological: She is alert and oriented to person, place, and time. No cranial nerve deficit. Coordination normal.  Skin: Skin is warm and dry. No rash noted. No erythema. No pallor.  Psychiatric: She has a  normal mood and affect. Her behavior is normal. Thought content normal.  Vitals reviewed.    Assessment & Plan:   1. Hypertension, unspecified type - Recommence taking anti-hypertensives. Patient has not taken anti-hypertensives for two days. Reports good control when she is taking her anti-hypertensives.  2. Acute pharyngitis, unspecified etiology - Begin amoxicillin-clavulanate (AUGMENTIN) 875-125 MG tablet; Take 1 tablet by mouth 2 (two) times daily.  Dispense: 20 tablet; Refill: 0 - Begin naproxen (NAPROSYN) 500 MG tablet; Take 1 tablet (500 mg total) by mouth 2 (two) times daily with a meal.  Dispense: 30 tablet; Refill: 0   Meds ordered this encounter  Medications  . amoxicillin-clavulanate (AUGMENTIN) 875-125 MG tablet    Sig: Take 1 tablet by mouth 2 (two) times daily.    Dispense:  20 tablet    Refill:  0    Order Specific Question:   Supervising Provider    Answer:   Quentin Angst [id number]  . naproxen (NAPROSYN) 500 MG tablet    Sig: Take 1 tablet (500 mg total) by mouth 2 (two) times daily with a meal.    Dispense:  30 tablet    Refill:  0    Order Specific Question:   Supervising Provider    Answer:   Quentin Angst [id number]    Follow-up: Return in about 4 weeks (around 12/15/2016) for insomnia f/u.   Loletta Specter PA

## 2016-11-17 NOTE — Patient Instructions (Signed)

## 2016-12-09 ENCOUNTER — Other Ambulatory Visit (INDEPENDENT_AMBULATORY_CARE_PROVIDER_SITE_OTHER): Payer: Self-pay | Admitting: Physician Assistant

## 2016-12-09 DIAGNOSIS — Z760 Encounter for issue of repeat prescription: Secondary | ICD-10-CM

## 2016-12-09 MED ORDER — NAPROXEN 500 MG PO TABS: 500.0000 mg | ORAL_TABLET | Freq: Two times a day (BID) | ORAL | 0 refills | Status: DC

## 2017-03-27 ENCOUNTER — Ambulatory Visit (INDEPENDENT_AMBULATORY_CARE_PROVIDER_SITE_OTHER): Payer: Medicaid Other | Admitting: Physician Assistant

## 2017-03-27 ENCOUNTER — Other Ambulatory Visit: Payer: Self-pay

## 2017-03-27 ENCOUNTER — Encounter (INDEPENDENT_AMBULATORY_CARE_PROVIDER_SITE_OTHER): Payer: Self-pay | Admitting: Physician Assistant

## 2017-03-27 VITALS — BP 144/84 | HR 67 | Temp 97.9°F | Wt 145.4 lb

## 2017-03-27 DIAGNOSIS — R300 Dysuria: Secondary | ICD-10-CM | POA: Diagnosis not present

## 2017-03-27 DIAGNOSIS — D509 Iron deficiency anemia, unspecified: Secondary | ICD-10-CM

## 2017-03-27 DIAGNOSIS — E119 Type 2 diabetes mellitus without complications: Secondary | ICD-10-CM | POA: Diagnosis not present

## 2017-03-27 DIAGNOSIS — E781 Pure hyperglyceridemia: Secondary | ICD-10-CM | POA: Diagnosis not present

## 2017-03-27 DIAGNOSIS — N3000 Acute cystitis without hematuria: Secondary | ICD-10-CM | POA: Diagnosis not present

## 2017-03-27 LAB — POCT URINALYSIS DIPSTICK
BILIRUBIN UA: NEGATIVE
GLUCOSE UA: NEGATIVE
Ketones, UA: NEGATIVE
Nitrite, UA: NEGATIVE
Protein, UA: NEGATIVE
SPEC GRAV UA: 1.015 (ref 1.010–1.025)
Urobilinogen, UA: 0.2 E.U./dL
pH, UA: 5.5 (ref 5.0–8.0)

## 2017-03-27 LAB — POCT GLYCOSYLATED HEMOGLOBIN (HGB A1C): Hemoglobin A1C: 7.7

## 2017-03-27 MED ORDER — GLIMEPIRIDE 2 MG PO TABS: ORAL_TABLET | ORAL | 11 refills | Status: DC

## 2017-03-27 MED ORDER — CIPROFLOXACIN HCL 500 MG PO TABS: 500.0000 mg | ORAL_TABLET | Freq: Two times a day (BID) | ORAL | 0 refills | Status: AC

## 2017-03-27 NOTE — Patient Instructions (Signed)

## 2017-03-27 NOTE — Progress Notes (Signed)
Subjective:  Patient ID: Katherine Barr, female    DOB: 28-Jun-1975  Age: 41 y.o. MRN: [id number]  CC: cystitis  HPI  Katherine Barr is a 41 y.o. female with a PMH of HTN, HLD, DM2, anemia, and GERD presents with dysuria, urinary frequency, suprapubic pressure, and right flank discomfort x2 days. Does not endorse fever, chills, nausea, or vomiting.    Would like to have f/u blood work done for her diabetes and cholesterol. Last A1c 7.5% six months ago and was found to have triglycerides at 265 mg/dL.    Outpatient Medications Prior to Visit  Medication Sig Dispense Refill  . atorvastatin (LIPITOR) 10 MG tablet Take 1 tablet (10 mg total) by mouth daily. 90 tablet 1  . Cholecalciferol (VITAMIN D-3) 5000 units TABS Take 1 tablet by mouth daily. 30 tablet 1  . dextromethorphan (DELSYM) 30 MG/5ML liquid Take 10 mLs (60 mg total) by mouth 2 (two) times daily. 148 mL 0  . ferrous sulfate 325 (65 FE) MG tablet Take 1 tablet (325 mg total) by mouth daily with breakfast. 60 tablet 1  . loratadine (CLARITIN) 10 MG tablet Take 1 tablet (10 mg total) by mouth daily. 30 tablet 11  . MEGARED OMEGA-3 KRILL OIL 500 MG CAPS Take 1 capsule by mouth daily. 30 capsule 5  . mirtazapine (REMERON) 15 MG tablet Take 0.5 tablets (7.5 mg total) by mouth at bedtime. 45 tablet 3  . montelukast (SINGULAIR) 10 MG tablet Take 10 mg by mouth at bedtime.    . naproxen (NAPROSYN) 500 MG tablet Take 1 tablet (500 mg total) by mouth 2 (two) times daily with a meal. 30 tablet 0  . pantoprazole (PROTONIX) 40 MG tablet Take 1 tablet (40 mg total) by mouth daily. 30 tablet 3  . propranolol (INDERAL) 80 MG tablet Take 1 tablet (80 mg total) by mouth 3 (three) times daily. 270 tablet 1  . sitaGLIPtin-metformin (JANUMET) 50-1000 MG tablet Take 1 tablet by mouth 2 (two) times daily with a meal. 60 tablet 11  . vitamin B-12 (CYANOCOBALAMIN) 100 MCG tablet Take 1 tablet (100 mcg total) by mouth daily. 14 tablet 0   No  facility-administered medications prior to visit.      ROS Review of Systems  Constitutional: Negative for chills, fever and malaise/fatigue.  Eyes: Negative for blurred vision.  Respiratory: Negative for shortness of breath.   Cardiovascular: Negative for chest pain and palpitations.  Gastrointestinal: Positive for abdominal pain (suprapubic). Negative for nausea and vomiting.  Genitourinary: Positive for flank pain. Negative for dysuria and hematuria.  Musculoskeletal: Negative for joint pain and myalgias.  Skin: Negative for rash.  Neurological: Negative for tingling and headaches.  Psychiatric/Behavioral: Negative for depression. The patient is not nervous/anxious.     Objective:  BP (!) 144/84 (BP Location: Left Arm, Patient Position: Sitting, Cuff Size: Normal)   Pulse 67   Temp 97.9 F (36.6 C) (Oral)   Wt 145 lb 6.4 oz (66 kg)   LMP 03/13/2017 (Exact Date)   SpO2 97%   BMI 29.37 kg/m   BP/Weight 03/27/2017 11/17/2016 10/24/2016  Systolic BP 144 178 131  Diastolic BP 84 103 81  Wt. (Lbs) 145.4 149.8 153.6  BMI 29.37 30.26 31.02      Physical Exam  Constitutional: She is oriented to person, place, and time.  Well developed, well nourished, NAD, polite  HENT:  Head: Normocephalic and atraumatic.  Eyes: No scleral icterus.  Neck: Normal range of motion. Neck supple.  No thyromegaly present.  Cardiovascular: Normal rate, regular rhythm and normal heart sounds.  Pulmonary/Chest: Effort normal and breath sounds normal.  Abdominal: Soft. Bowel sounds are normal. There is tenderness (mild suprapubic TTP).  Genitourinary:  Genitourinary Comments: No CVA tenderness bilaterally  Musculoskeletal: She exhibits no edema.  Neurological: She is alert and oriented to person, place, and time. No cranial nerve deficit. Coordination normal.  Skin: Skin is warm and dry. No rash noted. No erythema. No pallor.  Psychiatric: She has a normal mood and affect. Her behavior is normal.  Thought content normal.  Vitals reviewed.    Assessment & Plan:   1. Acute cystitis without hematuria - Begin Ciprofloxacin 500 mg BID x3 days #6, zero refills.  2. Dysuria - Urinalysis Dipstick with moderate leukocytes in clinic today - Urine Culture  3. Type 2 diabetes mellitus without complication, without long-term current use of insulin (HCC) - HgB A1c 7.7% in clinic today - Basic Metabolic Panel - Continue on Janumet - Begin Glimepiride. Instructions written to begin Glimepiride after finishing Cipro.   4. Hypertriglyceridemia - Lipid panel - Basic Metabolic Panel  5. Iron deficiency anemia, unspecified iron deficiency anemia type - CBC with Differential   Meds ordered this encounter  Medications  . ciprofloxacin (CIPRO) 500 MG tablet    Sig: Take 1 tablet (500 mg total) 2 (two) times daily for 3 days by mouth.    Dispense:  6 tablet    Refill:  0    Order Specific Question:   Supervising Provider    Answer:   Quentin AngstJEGEDE, OLUGBEMIGA E [[id number]]    Follow-up: Return in about 6 months (around 09/24/2017) for Diabetes. Return in 2-4 weeks for PAP, mammogram order, Prevnar, and Tdap.  Loletta Specteroger David Senaida Chilcote PA

## 2017-03-28 ENCOUNTER — Telehealth (INDEPENDENT_AMBULATORY_CARE_PROVIDER_SITE_OTHER): Payer: Self-pay

## 2017-03-28 LAB — CBC WITH DIFFERENTIAL/PLATELET
Basophils Absolute: 0 10*3/uL (ref 0.0–0.2)
Basos: 0 %
EOS (ABSOLUTE): 0.2 10*3/uL (ref 0.0–0.4)
Eos: 2 %
HEMATOCRIT: 35.2 % (ref 34.0–46.6)
Hemoglobin: 11.6 g/dL (ref 11.1–15.9)
Immature Grans (Abs): 0 10*3/uL (ref 0.0–0.1)
Immature Granulocytes: 0 %
LYMPHS ABS: 2.9 10*3/uL (ref 0.7–3.1)
LYMPHS: 30 %
MCH: 25.7 pg — AB (ref 26.6–33.0)
MCHC: 33 g/dL (ref 31.5–35.7)
MCV: 78 fL — AB (ref 79–97)
MONOCYTES: 4 %
MONOS ABS: 0.4 10*3/uL (ref 0.1–0.9)
NEUTROS ABS: 6.2 10*3/uL (ref 1.4–7.0)
Neutrophils: 64 %
PLATELETS: 375 10*3/uL (ref 150–379)
RBC: 4.52 x10E6/uL (ref 3.77–5.28)
RDW: 14.9 % (ref 12.3–15.4)
WBC: 9.7 10*3/uL (ref 3.4–10.8)

## 2017-03-28 LAB — LIPID PANEL
CHOL/HDL RATIO: 3.2 ratio (ref 0.0–4.4)
Cholesterol, Total: 126 mg/dL (ref 100–199)
HDL: 40 mg/dL (ref >39)
LDL Calculated: 53 mg/dL (ref 0–99)
Triglycerides: 163 mg/dL — ABNORMAL HIGH (ref 0–149)
VLDL Cholesterol Cal: 33 mg/dL (ref 5–40)

## 2017-03-28 LAB — BASIC METABOLIC PANEL
BUN/Creatinine Ratio: 15 (ref 9–23)
BUN: 8 mg/dL (ref 6–24)
CALCIUM: 9.5 mg/dL (ref 8.7–10.2)
CHLORIDE: 99 mmol/L (ref 96–106)
CO2: 23 mmol/L (ref 20–29)
Creatinine, Ser: 0.54 mg/dL — ABNORMAL LOW (ref 0.57–1.00)
GFR calc Af Amer: 137 mL/min/{1.73_m2} (ref >59)
GFR calc non Af Amer: 118 mL/min/{1.73_m2} (ref >59)
GLUCOSE: 110 mg/dL — AB (ref 65–99)
Potassium: 4.4 mmol/L (ref 3.5–5.2)
Sodium: 137 mmol/L (ref 134–144)

## 2017-03-28 NOTE — Telephone Encounter (Signed)
-----   Message from Loletta Specteroger David Gomez, PA-C sent at 03/28/2017  8:28 AM EST ----- Trigs mildly elevated but much better than before. Not anemic at this point. Rest of labs normal/unremarkable. Continue on same medications.

## 2017-03-28 NOTE — Telephone Encounter (Signed)
Patient aware of mildly elevated trigs and other labs normal and to continue taking the same medications. Maryjean Mornempestt S Roberts, CMA

## 2017-03-29 LAB — URINE CULTURE

## 2017-04-14 ENCOUNTER — Encounter: Payer: Self-pay | Admitting: Obstetrics & Gynecology

## 2017-04-14 ENCOUNTER — Ambulatory Visit (INDEPENDENT_AMBULATORY_CARE_PROVIDER_SITE_OTHER): Payer: Medicaid Other | Admitting: Obstetrics & Gynecology

## 2017-04-14 ENCOUNTER — Other Ambulatory Visit (HOSPITAL_COMMUNITY)
Admission: RE | Admit: 2017-04-14 | Discharge: 2017-04-14 | Disposition: A | Discharge status: Home / Self Care | Payer: Medicaid Other | Source: Ambulatory Visit | Attending: Obstetrics & Gynecology | Admitting: Obstetrics & Gynecology

## 2017-04-14 VITALS — BP 146/93 | HR 75 | Wt 144.0 lb

## 2017-04-14 DIAGNOSIS — Z01419 Encounter for gynecological examination (general) (routine) without abnormal findings: Secondary | ICD-10-CM | POA: Diagnosis not present

## 2017-04-14 DIAGNOSIS — Z0000 Encounter for general adult medical examination without abnormal findings: Secondary | ICD-10-CM

## 2017-04-14 DIAGNOSIS — Z1231 Encounter for screening mammogram for malignant neoplasm of breast: Secondary | ICD-10-CM

## 2017-04-14 DIAGNOSIS — B373 Candidiasis of vulva and vagina: Secondary | ICD-10-CM | POA: Diagnosis not present

## 2017-04-14 NOTE — Patient Instructions (Addendum)
Cervical polyp, was small   Preventive Care 40-64 Years, Female Preventive care refers to lifestyle choices and visits with your health care provider that can promote health and wellness. What does preventive care include?  A yearly physical exam. This is also called an annual well check.  Dental exams once or twice a year.  Routine eye exams. Ask your health care provider how often you should have your eyes checked.  Personal lifestyle choices, including: ? Daily care of your teeth and gums. ? Regular physical activity. ? Eating a healthy diet. ? Avoiding tobacco and drug use. ? Limiting alcohol use. ? Practicing safe sex. ? Taking low-dose aspirin daily starting at age 61. ? Taking vitamin and mineral supplements as recommended by your health care provider. What happens during an annual well check? The services and screenings done by your health care provider during your annual well check will depend on your age, overall health, lifestyle risk factors, and family history of disease. Counseling Your health care provider may ask you questions about your:  Alcohol use.  Tobacco use.  Drug use.  Emotional well-being.  Home and relationship well-being.  Sexual activity.  Eating habits.  Work and work Statistician.  Method of birth control.  Menstrual cycle.  Pregnancy history.  Screening You may have the following tests or measurements:  Height, weight, and BMI.  Blood pressure.  Lipid and cholesterol levels. These may be checked every 5 years, or more frequently if you are over 67 years old.  Skin check.  Lung cancer screening. You may have this screening every year starting at age 40 if you have a 30-pack-year history of smoking and currently smoke or have quit within the past 15 years.  Fecal occult blood test (FOBT) of the stool. You may have this test every year starting at age 68.  Flexible sigmoidoscopy or colonoscopy. You may have a sigmoidoscopy  every 5 years or a colonoscopy every 10 years starting at age 55.  Hepatitis C blood test.  Hepatitis B blood test.  Sexually transmitted disease (STD) testing.  Diabetes screening. This is done by checking your blood sugar (glucose) after you have not eaten for a while (fasting). You may have this done every 1-3 years.  Mammogram. This may be done every 1-2 years. Talk to your health care provider about when you should start having regular mammograms. This may depend on whether you have a family history of breast cancer.  BRCA-related cancer screening. This may be done if you have a family history of breast, ovarian, tubal, or peritoneal cancers.  Pelvic exam and Pap test. This may be done every 3 years starting at age 70. Starting at age 17, this may be done every 5 years if you have a Pap test in combination with an HPV test.  Bone density scan. This is done to screen for osteoporosis. You may have this scan if you are at high risk for osteoporosis.  Discuss your test results, treatment options, and if necessary, the need for more tests with your health care provider. Vaccines Your health care provider may recommend certain vaccines, such as:  Influenza vaccine. This is recommended every year.  Tetanus, diphtheria, and acellular pertussis (Tdap, Td) vaccine. You may need a Td booster every 10 years.  Varicella vaccine. You may need this if you have not been vaccinated.  Zoster vaccine. You may need this after age 60.  Measles, mumps, and rubella (MMR) vaccine. You may need at least one dose of  MMR if you were born in 1957 or later. You may also need a second dose.  Pneumococcal 13-valent conjugate (PCV13) vaccine. You may need this if you have certain conditions and were not previously vaccinated.  Pneumococcal polysaccharide (PPSV23) vaccine. You may need one or two doses if you smoke cigarettes or if you have certain conditions.  Meningococcal vaccine. You may need this if  you have certain conditions.  Hepatitis A vaccine. You may need this if you have certain conditions or if you travel or work in places where you may be exposed to hepatitis A.  Hepatitis B vaccine. You may need this if you have certain conditions or if you travel or work in places where you may be exposed to hepatitis B.  Haemophilus influenzae type b (Hib) vaccine. You may need this if you have certain conditions.  Talk to your health care provider about which screenings and vaccines you need and how often you need them. This information is not intended to replace advice given to you by your health care provider. Make sure you discuss any questions you have with your health care provider. Document Released: 05/22/2015 Document Revised: 01/13/2016 Document Reviewed: 02/24/2015 Elsevier Interactive Patient Education  2017 [employer]  

## 2017-04-14 NOTE — Progress Notes (Signed)
GYNECOLOGY ANNUAL PREVENTATIVE CARE ENCOUNTER NOTE  Subjective:   Katherine Barr is a 41 y.o. 402P2002 female here for a routine annual gynecologic exam.  Current complaints: none.   Denies abnormal vaginal bleeding, discharge, pelvic pain, problems with intercourse or other gynecologic concerns.    Gynecologic History No LMP recorded. Contraception: tubal ligation Last Pap: 2- 3 years. Results were: normal Last mammogram: 2014. Results were: normal  Obstetric History OB History  Gravida Para Term Preterm AB Living  2 2 2     2   SAB TAB Ectopic Multiple Live Births          2    # Outcome Date GA Lbr Len/2nd Weight Sex Delivery Anes PTL Lv  2 Term 01/22/14    F CS-LTranv   LIV  1 Term 07/09/09     Vag-Spont   LIV     Complications: Retained placenta with hemorrhage, postpartum condition     Birth Comments: Resolved placental previa, long induction, manual extraction of retained placenta,had some stress incontinence. Had elective C/S next delivery      Past Medical History:  Diagnosis Date  . Acid indigestion 09/07/2016  . Anemia 10/24/2016  . Diabetes (HCC)   . HTN (hypertension)     Past Surgical History:  Procedure Laterality Date  . CESAREAN SECTION WITH BILATERAL TUBAL LIGATION  2015    Current Outpatient Medications on File Prior to Visit  Medication Sig Dispense Refill  . atorvastatin (LIPITOR) 10 MG tablet Take 1 tablet (10 mg total) by mouth daily. 90 tablet 1  . Cholecalciferol (VITAMIN D-3) 5000 units TABS Take 1 tablet by mouth daily. 30 tablet 1  . ferrous sulfate 325 (65 FE) MG tablet Take 1 tablet (325 mg total) by mouth daily with breakfast. 60 tablet 1  . glimepiride (AMARYL) 2 MG tablet Take one tablet daily by mouth. Begin after finishing ciprofloxacin for UTI. 30 tablet 11  . loratadine (CLARITIN) 10 MG tablet Take 1 tablet (10 mg total) by mouth daily. 30 tablet 11  . MEGARED OMEGA-3 KRILL OIL 500 MG CAPS Take 1 capsule by mouth daily. 30  capsule 5  . mirtazapine (REMERON) 15 MG tablet Take 0.5 tablets (7.5 mg total) by mouth at bedtime. 45 tablet 3  . montelukast (SINGULAIR) 10 MG tablet Take 10 mg by mouth at bedtime.    . naproxen (NAPROSYN) 500 MG tablet Take 1 tablet (500 mg total) by mouth 2 (two) times daily with a meal. 30 tablet 0  . pantoprazole (PROTONIX) 40 MG tablet Take 1 tablet (40 mg total) by mouth daily. 30 tablet 3  . propranolol (INDERAL) 80 MG tablet Take 1 tablet (80 mg total) by mouth 3 (three) times daily. 270 tablet 1  . sitaGLIPtin-metformin (JANUMET) 50-1000 MG tablet Take 1 tablet by mouth 2 (two) times daily with a meal. 60 tablet 11  . vitamin B-12 (CYANOCOBALAMIN) 100 MCG tablet Take 1 tablet (100 mcg total) by mouth daily. 14 tablet 0   No current facility-administered medications on file prior to visit.     Allergies  Allergen Reactions  . Amlodipine Swelling  . Lisinopril Cough    Social History   Socioeconomic History  . Marital status: Legally Separated    Spouse name: Not on file  . Number of children: Not on file  . Years of education: Not on file  . Highest education level: Not on file  Social Needs  . Financial resource strain: Not on file  .  Food insecurity - worry: Not on file  . Food insecurity - inability: Not on file  . Transportation needs - medical: Not on file  . Transportation needs - non-medical: Not on file  Occupational History  . Not on file  Tobacco Use  . Smoking status: Never Smoker  . Smokeless tobacco: Never Used  Substance and Sexual Activity  . Alcohol use: No    Frequency: Never  . Drug use: No  . Sexual activity: Yes    Partners: Male  Other Topics Concern  . Not on file  Social History Narrative  . Not on file    No family history on file.  The following portions of the patient's history were reviewed and updated as appropriate: allergies, current medications, past family history, past medical history, past social history, past surgical  history and problem list.  Review of Systems Pertinent items noted in HPI and remainder of comprehensive ROS otherwise negative.   Objective:  BP (!) 146/93   Pulse 75   Wt 144 lb (65.3 kg)   BMI 29.08 kg/m  CONSTITUTIONAL: Well-developed, well-nourished female in no acute distress.  HENT:  Normocephalic, atraumatic, External right and left ear normal. Oropharynx is clear and moist EYES: Conjunctivae and EOM are normal. Pupils are equal, round, and reactive to light. No scleral icterus.  NECK: Normal range of motion, supple, no masses.  Normal thyroid.  SKIN: Skin is warm and dry. No rash noted. Not diaphoretic. No erythema. No pallor. NEUROLOGIC: Alert and oriented to person, place, and time. Normal reflexes, muscle tone coordination. No cranial nerve deficit noted. PSYCHIATRIC: Normal mood and affect. Normal behavior. Normal judgment and thought content. CARDIOVASCULAR: Normal heart rate noted, regular rhythm RESPIRATORY: Clear to auscultation bilaterally. Effort and breath sounds normal, no problems with respiration noted. BREASTS: Symmetric in size. No masses, skin changes, nipple drainage, or lymphadenopathy. ABDOMEN: Soft, normal bowel sounds, no distention noted.  No tenderness, rebound or guarding.  PELVIC: Normal appearing external genitalia; normal appearing vaginal mucosa and cervix. Small 3 mm cervical polyp noted in the cervical os with thin stalk, very friable and had significant after Pap smear. No abnormal discharge noted.  Normal uterine size, no other palpable masses, no uterine or adnexal tenderness. MUSCULOSKELETAL: Normal range of motion. No tenderness.  No cyanosis, clubbing, or edema.  2+ distal pulses.   Assessment and Plan:  1. Women's annual routine gynecological examination Will follow up results of pap smear and manage accordingly. No indication to remove cervical polyp, patient denies any bleeding.Will monior, remove if symptomatic.  - Cytology - PAP -  Cervicovaginal ancillary only  2. Visit for screening mammogram Mammogram scheduled - MM SCREENING BREAST TOMO BILATERAL; Future  Routine preventative health maintenance measures emphasized. Please refer to After Visit Summary for other counseling recommendations.    Jaynie CollinsUGONNA  Tsuneo Faison, MD, FACOG Attending Obstetrician & Gynecologist, [facility]

## 2017-04-18 ENCOUNTER — Other Ambulatory Visit: Payer: Self-pay | Admitting: Obstetrics & Gynecology

## 2017-04-18 DIAGNOSIS — B373 Candidiasis of vulva and vagina: Secondary | ICD-10-CM

## 2017-04-18 DIAGNOSIS — B3731 Acute candidiasis of vulva and vagina: Secondary | ICD-10-CM

## 2017-04-18 LAB — CERVICOVAGINAL ANCILLARY ONLY
Bacterial vaginitis: NEGATIVE
Candida vaginitis: POSITIVE — AB
Chlamydia: NEGATIVE
NEISSERIA GONORRHEA: NEGATIVE
TRICH (WINDOWPATH): NEGATIVE

## 2017-04-18 MED ORDER — FLUCONAZOLE 150 MG PO TABS: 150.0000 mg | ORAL_TABLET | Freq: Once | ORAL | 3 refills | Status: AC

## 2017-04-19 ENCOUNTER — Other Ambulatory Visit: Payer: Self-pay

## 2017-04-19 ENCOUNTER — Telehealth: Payer: Self-pay

## 2017-04-19 MED ORDER — FLUCONAZOLE 150 MG PO TABS: 150.0000 mg | ORAL_TABLET | Freq: Once | ORAL | 0 refills | Status: AC

## 2017-04-19 NOTE — Telephone Encounter (Signed)
Called patient inform her of test results and the need to be treated.

## 2017-04-19 NOTE — Telephone Encounter (Signed)
-----   Message from Tereso NewcomerUgonna A Anyanwu, MD sent at 04/18/2017  4:08 PM EST ----- Vaginal discharge test is abnormal and showed yeast infection. Diflucan was prescribed. Please inform patient of results and advise to pick up prescription.

## 2017-04-21 LAB — CYTOLOGY - PAP
DIAGNOSIS: NEGATIVE
HPV: NOT DETECTED

## 2017-04-25 ENCOUNTER — Other Ambulatory Visit (INDEPENDENT_AMBULATORY_CARE_PROVIDER_SITE_OTHER): Payer: Self-pay | Admitting: Physician Assistant

## 2017-04-25 DIAGNOSIS — Z760 Encounter for issue of repeat prescription: Secondary | ICD-10-CM

## 2017-04-25 DIAGNOSIS — K30 Functional dyspepsia: Secondary | ICD-10-CM

## 2017-04-26 NOTE — Telephone Encounter (Signed)
FWD to PCP. Gus Littler S Jaylun Fleener, CMA  

## 2017-05-10 ENCOUNTER — Other Ambulatory Visit (INDEPENDENT_AMBULATORY_CARE_PROVIDER_SITE_OTHER): Payer: Medicaid Other

## 2017-05-11 ENCOUNTER — Other Ambulatory Visit (INDEPENDENT_AMBULATORY_CARE_PROVIDER_SITE_OTHER): Payer: Medicaid Other

## 2017-05-15 ENCOUNTER — Other Ambulatory Visit (INDEPENDENT_AMBULATORY_CARE_PROVIDER_SITE_OTHER): Payer: Medicaid Other

## 2017-05-15 DIAGNOSIS — Z23 Encounter for immunization: Secondary | ICD-10-CM

## 2017-05-17 ENCOUNTER — Ambulatory Visit
Admission: RE | Admit: 2017-05-17 | Discharge: 2017-05-17 | Disposition: A | Discharge status: Home / Self Care | Payer: Medicaid Other | Source: Ambulatory Visit | Attending: Obstetrics & Gynecology | Admitting: Obstetrics & Gynecology

## 2017-05-17 DIAGNOSIS — Z1231 Encounter for screening mammogram for malignant neoplasm of breast: Secondary | ICD-10-CM

## 2017-05-23 ENCOUNTER — Ambulatory Visit (INDEPENDENT_AMBULATORY_CARE_PROVIDER_SITE_OTHER): Payer: Medicaid Other | Admitting: Physician Assistant

## 2017-05-24 ENCOUNTER — Encounter (INDEPENDENT_AMBULATORY_CARE_PROVIDER_SITE_OTHER): Payer: Self-pay | Admitting: Physician Assistant

## 2017-05-24 ENCOUNTER — Ambulatory Visit (INDEPENDENT_AMBULATORY_CARE_PROVIDER_SITE_OTHER): Payer: Medicaid Other | Admitting: Physician Assistant

## 2017-05-24 VITALS — BP 144/82 | HR 69 | Temp 97.4°F | Resp 16 | Wt 145.0 lb

## 2017-05-24 DIAGNOSIS — I10 Essential (primary) hypertension: Secondary | ICD-10-CM | POA: Diagnosis not present

## 2017-05-24 DIAGNOSIS — F439 Reaction to severe stress, unspecified: Secondary | ICD-10-CM | POA: Diagnosis not present

## 2017-05-24 DIAGNOSIS — G43809 Other migraine, not intractable, without status migrainosus: Secondary | ICD-10-CM

## 2017-05-24 DIAGNOSIS — G479 Sleep disorder, unspecified: Secondary | ICD-10-CM

## 2017-05-24 MED ORDER — HYDROCHLOROTHIAZIDE 12.5 MG PO TABS: 12.5000 mg | ORAL_TABLET | Freq: Every day | ORAL | 3 refills | Status: DC

## 2017-05-24 MED ORDER — TOPIRAMATE 25 MG PO TABS: 25.0000 mg | ORAL_TABLET | Freq: Every day | ORAL | 2 refills | Status: DC

## 2017-05-24 MED ORDER — SUMATRIPTAN SUCCINATE 25 MG PO TABS: 25.0000 mg | ORAL_TABLET | Freq: Every day | ORAL | 0 refills | Status: DC

## 2017-05-24 NOTE — Patient Instructions (Signed)

## 2017-05-24 NOTE — Progress Notes (Signed)
Subjective:  Patient ID: Katherine Barr, female    DOB: [birth year]  Age: 42 y.o. MRN: [id number]  CC: headache  HPI  Katherine Barr a 41 y.o.femalewith a PMH of HTN, HLD, DM2, anemia, and GERD presents with headache, elevated blood, and tingling of the feet at night. Headache felt once per week. Headache last 2 - 24 hrs. Associated with visual blurring, photophobia, nausea, and vomiting. Has trouble maintaining sleep. Does not want a prescription for sleep aids. 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            Outpatient Medications Prior to Visit  Medication Sig Dispense Refill  . atorvastatin (LIPITOR) 10 MG tablet TAKE 1 TABLET BY MOUTH EVERY DAY 90 tablet 1  . Cholecalciferol (VITAMIN D-3) 5000 units TABS Take 1 tablet by mouth daily. 30 tablet 1  . ferrous sulfate 325 (65 FE) MG tablet Take 1 tablet (325 mg total) by mouth daily with breakfast. 60 tablet 1  . glimepiride (AMARYL) 2 MG tablet Take one tablet daily by mouth. Begin after finishing ciprofloxacin for UTI. 30 tablet 11  . loratadine (CLARITIN) 10 MG tablet Take 1 tablet (10 mg total) by mouth daily. 30 tablet 11  . MEGARED OMEGA-3 KRILL OIL 500 MG CAPS Take 1 capsule by mouth daily. 30 capsule 5  . mirtazapine (REMERON) 15 MG tablet Take 0.5 tablets (7.5 mg total) by mouth at bedtime. 45 tablet 3  . montelukast (SINGULAIR) 10 MG tablet Take 10 mg by mouth at bedtime.    . naproxen (NAPROSYN) 500 MG tablet Take 1 tablet (500 mg total) by mouth 2 (two) times daily with a meal. 30 tablet 0  . pantoprazole (PROTONIX) 40 MG tablet TAKE 1 TABLET BY MOUTH DAILY 30 tablet 2  . propranolol (INDERAL) 80 MG tablet Take 1 tablet (80 mg total) by mouth 3 (three) times daily. 270 tablet 1  . sitaGLIPtin-metformin (JANUMET) 50-1000 MG tablet Take 1 tablet by mouth 2 (two) times daily with a meal. 60 tablet 11  . vitamin B-12 (CYANOCOBALAMIN) 100  MCG tablet Take 1 tablet (100 mcg total) by mouth daily. 14 tablet 0   No facility-administered medications prior to visit.      ROS Review of Systems  Constitutional: Negative for chills, fever and malaise/fatigue.  Eyes: Negative for blurred vision.  Respiratory: Negative for shortness of breath.   Cardiovascular: Negative for chest pain and palpitations.  Gastrointestinal: Negative for abdominal pain and nausea.  Genitourinary: Negative for dysuria and hematuria.  Musculoskeletal: Negative for joint pain and myalgias.  Skin: Negative for rash.  Neurological: Positive for headaches. Negative for tingling.  Psychiatric/Behavioral: Negative for depression. The patient has insomnia. The patient is not nervous/anxious.     Objective:   Vitals:   05/24/17 1538  BP: (!) 144/82  Pulse: 69  Resp: 16  Temp: (!) 97.4 F (36.3 C)  SpO2: 99%    Physical Exam  Constitutional: She is oriented to person, place, and time.  Well developed, well nourished, NAD, polite  HENT:  Head: Normocephalic and atraumatic.  Eyes: No scleral icterus.  Neck: Normal range of motion. Neck supple. No thyromegaly present.  Cardiovascular: Normal rate, regular rhythm and normal heart sounds.  Pulmonary/Chest: Effort normal and breath sounds normal.  Musculoskeletal: She exhibits no edema.  Neurological: She is alert and oriented to person, place, and time. No cranial nerve deficit. Coordination normal.  Skin: Skin is  warm and dry. No rash noted. No erythema. No pallor.  Psychiatric: She has a normal mood and affect. Her behavior is normal. Thought content normal.  Vitals reviewed.    Assessment & Plan:    1. Other migraine without status migrainosus, not intractable -Begin topiramate (TOPAMAX) 25 MG tablet; Take 1 tablet (25 mg total) by mouth daily.  Dispense: 30 tablet; Refill: 2 - Begin SUMAtriptan (IMITREX) 25 MG tablet; Take 1 tablet (25 mg total) by mouth daily. May take one more tablet two  hours after the first. No more than two tablets per day.  Dispense: 30 tablet; Refill: 0  2. Hypertension, unspecified type - Begin hydrochlorothiazide (HYDRODIURIL) 12.5 MG tablet; Take 1 tablet (12.5 mg total) by mouth daily.  Dispense: 90 tablet; Refill: 3  3. Sleep disturbance - Patient declines sleep aids. 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  4. Stress at home - Counseled patient on detriments of stress, both physical and mental. Patient will try to find a way to spend more time on herself.    Meds ordered this encounter  Medications  . topiramate (TOPAMAX) 25 MG tablet    Sig: Take 1 tablet (25 mg total) by mouth daily.    Dispense:  30 tablet    Refill:  2    Order Specific Question:   Supervising Provider    Answer:   Quentin AngstJEGEDE, OLUGBEMIGA E [id number]]  . SUMAtriptan (IMITREX) 25 MG tablet    Sig: Take 1 tablet (25 mg total) by mouth daily. May take one more tablet two hours after the first. No more than two tablets per day.    Dispense:  30 tablet    Refill:  0    Order Specific Question:   Supervising Provider    Answer:   Quentin AngstJEGEDE, OLUGBEMIGA E [id number]  . hydrochlorothiazide (HYDRODIURIL) 12.5 MG tablet    Sig: Take 1 tablet (12.5 mg total) by mouth daily.    Dispense:  90 tablet    Refill:  3    Order Specific Question:   Supervising Provider    Answer:   Quentin AngstJEGEDE, OLUGBEMIGA E [id number]]    Follow-up: Return in about 6 weeks (around 07/05/2017) for headache.   Loletta Specteroger David Ravin Denardo PA

## 2017-07-05 ENCOUNTER — Ambulatory Visit (INDEPENDENT_AMBULATORY_CARE_PROVIDER_SITE_OTHER): Payer: Medicaid Other | Admitting: Physician Assistant

## 2017-07-05 ENCOUNTER — Encounter (INDEPENDENT_AMBULATORY_CARE_PROVIDER_SITE_OTHER): Payer: Self-pay | Admitting: Physician Assistant

## 2017-07-05 VITALS — BP 122/73 | HR 78 | Temp 98.0°F | Resp 18 | Ht 62.0 in | Wt 146.0 lb

## 2017-07-05 DIAGNOSIS — E119 Type 2 diabetes mellitus without complications: Secondary | ICD-10-CM

## 2017-07-05 DIAGNOSIS — R519 Headache, unspecified: Secondary | ICD-10-CM

## 2017-07-05 DIAGNOSIS — R51 Headache: Secondary | ICD-10-CM | POA: Diagnosis not present

## 2017-07-05 LAB — GLUCOSE, POCT (MANUAL RESULT ENTRY): POC GLUCOSE: 184 mg/dL — AB (ref 70–99)

## 2017-07-05 LAB — POCT GLYCOSYLATED HEMOGLOBIN (HGB A1C): HEMOGLOBIN A1C: 7.1

## 2017-07-05 MED ORDER — GLIMEPIRIDE 4 MG PO TABS: 4.0000 mg | ORAL_TABLET | Freq: Every day | ORAL | 5 refills | Status: DC

## 2017-07-05 NOTE — Progress Notes (Signed)
Subjective:  Patient ID: Katherine Barr, female    DOB: [birth year]  Age: 42 y.o. MRN: [id number]  CC: headache  HPI Katherine Barr a 41 y.o.femalewith a PMH of HTN, HLD, DM2, anemia, and GERDpresents with headache, elevated blood, and tingling of the feet at night presents for f/u of headache. Complained of having headache once per week. Headache last 2 - 24 hrs. Associated with visual blurring, photophobia, nausea, and vomiting. Topiramate and Sumatriptan was prescribed one month and a half ago. Filled medications but did not take the medications because her headache resolved on its own. Reports unilateral headache at times and bitemporal headache at times. Believes stress may likely be the cause of her headaches.     Here also for f/u of DM2. Last A1c 7.7% three months ago. A1c 7.1% today. Taking medications as directed. No other symptoms or complaints to report.   Outpatient Medications Prior to Visit  Medication Sig Dispense Refill  . atorvastatin (LIPITOR) 10 MG tablet TAKE 1 TABLET BY MOUTH EVERY DAY 90 tablet 1  . Cholecalciferol (VITAMIN D-3) 5000 units TABS Take 1 tablet by mouth daily. 30 tablet 1  . ferrous sulfate 325 (65 FE) MG tablet Take 1 tablet (325 mg total) by mouth daily with breakfast. 60 tablet 1  . glimepiride (AMARYL) 2 MG tablet Take one tablet daily by mouth. Begin after finishing ciprofloxacin for UTI. 30 tablet 11  . hydrochlorothiazide (HYDRODIURIL) 12.5 MG tablet Take 1 tablet (12.5 mg total) by mouth daily. 90 tablet 3  . loratadine (CLARITIN) 10 MG tablet Take 1 tablet (10 mg total) by mouth daily. 30 tablet 11  . MEGARED OMEGA-3 KRILL OIL 500 MG CAPS Take 1 capsule by mouth daily. 30 capsule 5  . montelukast (SINGULAIR) 10 MG tablet Take 10 mg by mouth at bedtime.    . pantoprazole (PROTONIX) 40 MG tablet TAKE 1 TABLET BY MOUTH DAILY 30 tablet 2  . propranolol (INDERAL) 80 MG tablet Take 1 tablet (80 mg total) by mouth 3 (three) times daily. 270  tablet 1  . sitaGLIPtin-metformin (JANUMET) 50-1000 MG tablet Take 1 tablet by mouth 2 (two) times daily with a meal. 60 tablet 11  . SUMAtriptan (IMITREX) 25 MG tablet Take 1 tablet (25 mg total) by mouth daily. May take one more tablet two hours after the first. No more than two tablets per day. 30 tablet 0  . topiramate (TOPAMAX) 25 MG tablet Take 1 tablet (25 mg total) by mouth daily. 30 tablet 2  . vitamin B-12 (CYANOCOBALAMIN) 100 MCG tablet Take 1 tablet (100 mcg total) by mouth daily. 14 tablet 0   No facility-administered medications prior to visit.      ROS Review of Systems  Constitutional: Negative for chills, fever and malaise/fatigue.  Eyes: Negative for blurred vision.  Respiratory: Negative for shortness of breath.   Cardiovascular: Negative for chest pain and palpitations.  Gastrointestinal: Negative for abdominal pain and nausea.  Genitourinary: Negative for dysuria and hematuria.  Musculoskeletal: Negative for joint pain and myalgias.  Skin: Negative for rash.  Neurological: Negative for tingling and headaches.  Psychiatric/Behavioral: Negative for depression. The patient is not nervous/anxious.     Objective:  BP 122/73 (BP Location: Left Arm, Patient Position: Sitting, Cuff Size: Normal)   Pulse 78   Temp 98 F (36.7 C) (Oral)   Resp 18   Ht 5\' 2"  (1.575 m)   Wt 146 lb (66.2 kg)   LMP 07/04/2017   SpO2 98%  BMI 26.70 kg/m   BP/Weight 07/05/2017 05/24/2017 04/14/2017  Systolic BP 122 144 146  Diastolic BP 73 82 93  Wt. (Lbs) 146 145 144  BMI 26.7 29.29 29.08      Physical Exam  Constitutional: She is oriented to person, place, and time.  Well developed, well nourished, NAD, polite  HENT:  Head: Normocephalic and atraumatic.  Eyes: No scleral icterus.  Neck: Normal range of motion. Neck supple. No thyromegaly present.  Cardiovascular: Normal rate, regular rhythm and normal heart sounds.  Pulmonary/Chest: Effort normal and breath sounds normal.   Musculoskeletal: She exhibits no edema.  Neurological: She is alert and oriented to person, place, and time.  Skin: Skin is warm and dry. No rash noted. No erythema. No pallor.  Psychiatric: She has a normal mood and affect. Her behavior is normal. Thought content normal.  Vitals reviewed.    Assessment & Plan:    1. Type 2 diabetes mellitus without complication, without long-term current use of insulin (HCC) - Last A1c 7.7% three months ago. A1c 7.1% today. - Increase glimepiride (AMARYL) 4 MG tablet; Take 1 tablet (4 mg total) by mouth daily before breakfast.  Dispense: 30 tablet; Refill: 5 - Continue Janumet  2. Nonintractable headache, unspecified chronicity pattern, unspecified headache type - Pt did not use Topiramate or Sumatriptan as directed. I have asked her to take these medications should her headaches resume.  Meds ordered this encounter  Medications  . glimepiride (AMARYL) 4 MG tablet    Sig: Take 1 tablet (4 mg total) by mouth daily before breakfast.    Dispense:  30 tablet    Refill:  5    Order Specific Question:   Supervising Provider    Answer:   Quentin AngstJEGEDE, OLUGBEMIGA E [[id number]]    Follow-up: No Follow-up on file.   Loletta Specteroger David Kyuss Hale PA

## 2017-07-05 NOTE — Progress Notes (Signed)
CBG 184 A1C 7.1

## 2017-07-05 NOTE — Patient Instructions (Signed)

## 2017-07-19 ENCOUNTER — Other Ambulatory Visit (INDEPENDENT_AMBULATORY_CARE_PROVIDER_SITE_OTHER): Payer: Self-pay | Admitting: Physician Assistant

## 2017-07-19 DIAGNOSIS — Z760 Encounter for issue of repeat prescription: Secondary | ICD-10-CM

## 2017-07-19 NOTE — Telephone Encounter (Signed)
FWD to PCP. Langford Carias S Durand Wittmeyer, CMA  

## 2017-08-01 ENCOUNTER — Other Ambulatory Visit: Payer: Self-pay

## 2017-08-01 ENCOUNTER — Ambulatory Visit (INDEPENDENT_AMBULATORY_CARE_PROVIDER_SITE_OTHER): Payer: Medicaid Other | Admitting: Physician Assistant

## 2017-08-01 ENCOUNTER — Encounter (INDEPENDENT_AMBULATORY_CARE_PROVIDER_SITE_OTHER): Payer: Self-pay | Admitting: Physician Assistant

## 2017-08-01 VITALS — BP 143/80 | HR 51 | Temp 98.0°F | Ht 62.0 in | Wt 147.4 lb

## 2017-08-01 DIAGNOSIS — N3001 Acute cystitis with hematuria: Secondary | ICD-10-CM

## 2017-08-01 DIAGNOSIS — R300 Dysuria: Secondary | ICD-10-CM

## 2017-08-01 LAB — POCT URINALYSIS DIPSTICK
GLUCOSE UA: NEGATIVE
Nitrite, UA: POSITIVE
PH UA: 6 (ref 5.0–8.0)
PROTEIN UA: 100
Spec Grav, UA: 1.02 (ref 1.010–1.025)
Urobilinogen, UA: 0.2 E.U./dL

## 2017-08-01 MED ORDER — CIPROFLOXACIN HCL 500 MG PO TABS: 500.0000 mg | ORAL_TABLET | Freq: Two times a day (BID) | ORAL | 0 refills | Status: AC

## 2017-08-01 NOTE — Patient Instructions (Signed)

## 2017-08-01 NOTE — Progress Notes (Signed)
Subjective:  Patient ID: Katherine Barr, female    DOB: Aug 28, 1975  Age: 42 y.o. MRN: [id number]  CC: possible UTI  HPI Katherine Barr a 41 y.o.femalewith a PMH of HTN, HLD, DM2, anemia, GERD, and s/p BTLpresents with general headache, dizziness, watery mouth, no taste, and nausea w/o vomiting for the last 2-3 days. Urinary symptom of oliguria, urinary frequency, and dysuria since this morning. Attributed to sex 2-3 days ago. Does not endorse any other symptoms.       Outpatient Medications Prior to Visit  Medication Sig Dispense Refill  . atorvastatin (LIPITOR) 10 MG tablet TAKE 1 TABLET BY MOUTH EVERY DAY 90 tablet 1  . Cholecalciferol (VITAMIN D-3) 5000 units TABS Take 1 tablet by mouth daily. 30 tablet 1  . ferrous sulfate 325 (65 FE) MG tablet Take 1 tablet (325 mg total) by mouth daily with breakfast. 60 tablet 1  . glimepiride (AMARYL) 4 MG tablet Take 1 tablet (4 mg total) by mouth daily before breakfast. 30 tablet 5  . hydrochlorothiazide (HYDRODIURIL) 12.5 MG tablet Take 1 tablet (12.5 mg total) by mouth daily. 90 tablet 3  . loratadine (CLARITIN) 10 MG tablet Take 1 tablet (10 mg total) by mouth daily. 30 tablet 11  . MEGARED OMEGA-3 KRILL OIL 500 MG CAPS Take 1 capsule by mouth daily. 30 capsule 5  . montelukast (SINGULAIR) 10 MG tablet Take 10 mg by mouth at bedtime.    . pantoprazole (PROTONIX) 40 MG tablet TAKE 1 TABLET BY MOUTH DAILY 30 tablet 2  . propranolol (INDERAL) 80 MG tablet TAKE 1 TABLET BY MOUTH 3 TIMES A DAY 90 tablet 0  . sitaGLIPtin-metformin (JANUMET) 50-1000 MG tablet Take 1 tablet by mouth 2 (two) times daily with a meal. 60 tablet 11  . SUMAtriptan (IMITREX) 25 MG tablet Take 1 tablet (25 mg total) by mouth daily. May take one more tablet two hours after the first. No more than two tablets per day. 30 tablet 0  . topiramate (TOPAMAX) 25 MG tablet Take 1 tablet (25 mg total) by mouth daily. 30 tablet 2   No facility-administered medications  prior to visit.      ROS Review of Systems  Constitutional: Negative for chills, fever and malaise/fatigue.  Eyes: Negative for blurred vision.  Respiratory: Negative for shortness of breath.   Cardiovascular: Negative for chest pain and palpitations.  Gastrointestinal: Negative for abdominal pain and nausea.  Genitourinary: Negative for dysuria and hematuria.  Musculoskeletal: Negative for joint pain and myalgias.  Skin: Negative for rash.  Neurological: Negative for tingling and headaches.  Psychiatric/Behavioral: Negative for depression. The patient is not nervous/anxious.     Objective:  BP (!) 143/80 (BP Location: Left Arm, Patient Position: Sitting, Cuff Size: Normal)   Pulse (!) 51   Temp 98 F (36.7 C) (Oral)   Ht 5\' 2"  (1.575 m)   Wt 147 lb 6.4 oz (66.9 kg)   LMP 07/28/2017 (Exact Date)   SpO2 96%   BMI 26.96 kg/m   BP/Weight 08/01/2017 07/05/2017 05/24/2017  Systolic BP 143 122 144  Diastolic BP 80 73 82  Wt. (Lbs) 147.4 146 145  BMI 26.96 26.7 29.29      Physical Exam  Constitutional: She is oriented to person, place, and time.  Well developed, well nourished, NAD, polite  HENT:  Head: Normocephalic and atraumatic.  Eyes: No scleral icterus.  Neck: Normal range of motion. Neck supple. No thyromegaly present.  Cardiovascular: Normal rate, regular rhythm and  normal heart sounds.  Pulmonary/Chest: Effort normal and breath sounds normal.  Genitourinary:  Genitourinary Comments: Mild right sided CVA tenderness  Musculoskeletal: She exhibits no edema.  Neurological: She is alert and oriented to person, place, and time. No cranial nerve deficit. Coordination normal.  Skin: Skin is warm and dry. No rash noted. No erythema. No pallor.  Psychiatric: She has a normal mood and affect. Her behavior is normal. Thought content normal.  Vitals reviewed.    Assessment & Plan:    1. Dysuria - Urinalysis Dipstick with positive nitrite and leukocytes.   - Urine  Culture - ciprofloxacin (CIPRO) 500 MG tablet; Take 1 tablet (500 mg total) by mouth 2 (two) times daily for 3 days.  Dispense: 6 tablet; Refill: 0 - Pt advised to suspend glimepiride during ciprofloxacin use.  2. Acute cystitis with hematuria - ciprofloxacin (CIPRO) 500 MG tablet; Take 1 tablet (500 mg total) by mouth 2 (two) times daily for 3 days.  Dispense: 6 tablet; Refill: 0        Meds ordered this encounter  Medications  . ciprofloxacin (CIPRO) 500 MG tablet    Sig: Take 1 tablet (500 mg total) by mouth 2 (two) times daily for 3 days.    Dispense:  6 tablet    Refill:  0    Order Specific Question:   Supervising Provider    Answer:   Quentin Angst [id number]    Follow-up: Return if symptoms worsen or fail to improve.   Loletta Specter PA

## 2017-08-03 ENCOUNTER — Other Ambulatory Visit (INDEPENDENT_AMBULATORY_CARE_PROVIDER_SITE_OTHER): Payer: Self-pay | Admitting: Physician Assistant

## 2017-08-03 LAB — URINE CULTURE

## 2017-08-03 MED ORDER — NITROFURANTOIN MACROCRYSTAL 100 MG PO CAPS: 100.0000 mg | ORAL_CAPSULE | Freq: Three times a day (TID) | ORAL | 0 refills | Status: DC

## 2017-08-04 ENCOUNTER — Telehealth (INDEPENDENT_AMBULATORY_CARE_PROVIDER_SITE_OTHER): Payer: Self-pay

## 2017-08-04 NOTE — Telephone Encounter (Signed)
-----   Message from Loletta Specteroger David Gomez, PA-C sent at 08/03/2017  7:58 PM EDT ----- Very resistant strain of E coli. Will send new abx.

## 2017-08-04 NOTE — Telephone Encounter (Signed)
Patient aware of urine culture showing a very resistant strain of E. Coli and a new antibiotic being sent to her pharmacy. Katherine Barr, CMA

## 2017-08-10 ENCOUNTER — Other Ambulatory Visit (INDEPENDENT_AMBULATORY_CARE_PROVIDER_SITE_OTHER): Payer: Self-pay | Admitting: Physician Assistant

## 2017-08-10 DIAGNOSIS — K30 Functional dyspepsia: Secondary | ICD-10-CM

## 2017-08-10 NOTE — Telephone Encounter (Signed)
FWD to PCP. Tempestt S Roberts, CMA  

## 2017-08-23 ENCOUNTER — Other Ambulatory Visit (INDEPENDENT_AMBULATORY_CARE_PROVIDER_SITE_OTHER): Payer: Self-pay | Admitting: Physician Assistant

## 2017-08-23 DIAGNOSIS — Z760 Encounter for issue of repeat prescription: Secondary | ICD-10-CM

## 2017-08-23 NOTE — Telephone Encounter (Signed)
She will need to come in for a nurse visit/ blood pressure check before propanolol can be refilled. Her last office visit showed a low heart rate and propanolol can decrease the heart rate substantially.

## 2017-08-23 NOTE — Telephone Encounter (Signed)
FWD to covering provider at [facility].

## 2017-09-03 ENCOUNTER — Other Ambulatory Visit (INDEPENDENT_AMBULATORY_CARE_PROVIDER_SITE_OTHER): Payer: Self-pay | Admitting: Physician Assistant

## 2017-09-03 DIAGNOSIS — K30 Functional dyspepsia: Secondary | ICD-10-CM

## 2017-09-04 ENCOUNTER — Ambulatory Visit (HOSPITAL_COMMUNITY)
Admission: EM | Admit: 2017-09-04 | Discharge: 2017-09-04 | Disposition: A | Discharge status: Home / Self Care | Payer: Medicaid Other | Attending: Urgent Care | Admitting: Urgent Care

## 2017-09-04 ENCOUNTER — Encounter (HOSPITAL_COMMUNITY): Payer: Self-pay | Admitting: *Deleted

## 2017-09-04 DIAGNOSIS — G43809 Other migraine, not intractable, without status migrainosus: Secondary | ICD-10-CM

## 2017-09-04 DIAGNOSIS — H53149 Visual discomfort, unspecified: Secondary | ICD-10-CM

## 2017-09-04 DIAGNOSIS — R42 Dizziness and giddiness: Secondary | ICD-10-CM

## 2017-09-04 DIAGNOSIS — I10 Essential (primary) hypertension: Secondary | ICD-10-CM

## 2017-09-04 DIAGNOSIS — R110 Nausea: Secondary | ICD-10-CM

## 2017-09-04 DIAGNOSIS — Z9109 Other allergy status, other than to drugs and biological substances: Secondary | ICD-10-CM

## 2017-09-04 MED ORDER — ONDANSETRON 8 MG PO TBDP: 8.0000 mg | ORAL_TABLET | Freq: Three times a day (TID) | ORAL | 0 refills | Status: DC | PRN

## 2017-09-04 MED ORDER — SUMATRIPTAN SUCCINATE 25 MG PO TABS: ORAL_TABLET | ORAL | 0 refills | Status: DC

## 2017-09-04 NOTE — Discharge Instructions (Signed)
Hydrate well with at least 2 liters (1 gallon) of water daily.  °

## 2017-09-04 NOTE — Telephone Encounter (Signed)
FWD to PCP. Kanda Deluna S Ahlia Lemanski, CMA  

## 2017-09-04 NOTE — ED Triage Notes (Addendum)
Patient reports sudden onset of frontal headache radiating to the back of her head with nausea and mild dizziness. Reports blurred vision. Ambulatory. Patient reports generalized weakness.   During review of meds with patient it is noted that she has prescription for imitrex and topomax but does not take.

## 2017-09-04 NOTE — ED Provider Notes (Signed)
MRN: [id number] DOB: [birth year]  Subjective:   Katherine Barr is a 42 y.o. female presenting for acute onset of migraine headache 2 hours ago.  Patient has associated photophobia, nausea without vomiting, dizziness, blurred vision, pain that radiates to the back of her neck.  Patient has not tried any medications for relief.  She states that she has had Topamax and Imitrex in the past but does not have any prescription for these right now.  She also has a history of allergies and takes loratadine for this.  She is very compliant with her blood pressure medications.  Denies confusion, fever, sinus pain, ear pain, ear drainage, chest pain, shortness of breath, heart racing, belly pain, rashes.  Patient has 1 cup of coffee per day.  She does not smoke cigarettes or drink alcohol.  Patient sleeps about 5 hours/day.  Per her husband patient plays video games in bed and is on phone apps in bed for hours.  She eats regular meals 3 times daily.  She hydrates with 24 to 32 ounces of water daily.  No current facility-administered medications for this encounter.   Current Outpatient Medications:  .  atorvastatin (LIPITOR) 10 MG tablet, TAKE 1 TABLET BY MOUTH EVERY DAY, Disp: 90 tablet, Rfl: 1 .  Cholecalciferol (VITAMIN D-3) 5000 units TABS, Take 1 tablet by mouth daily., Disp: 30 tablet, Rfl: 1 .  ferrous sulfate 325 (65 FE) MG tablet, Take 1 tablet (325 mg total) by mouth daily with breakfast., Disp: 60 tablet, Rfl: 1 .  glimepiride (AMARYL) 4 MG tablet, Take 1 tablet (4 mg total) by mouth daily before breakfast., Disp: 30 tablet, Rfl: 5 .  hydrochlorothiazide (HYDRODIURIL) 12.5 MG tablet, Take 1 tablet (12.5 mg total) by mouth daily., Disp: 90 tablet, Rfl: 3 .  loratadine (CLARITIN) 10 MG tablet, Take 1 tablet (10 mg total) by mouth daily., Disp: 30 tablet, Rfl: 11 .  MEGARED OMEGA-3 KRILL OIL 500 MG CAPS, Take 1 capsule by mouth daily., Disp: 30 capsule, Rfl: 5 .  montelukast (SINGULAIR) 10 MG tablet,  Take 10 mg by mouth at bedtime., Disp: , Rfl:  .  nitrofurantoin (MACRODANTIN) 100 MG capsule, Take 1 capsule (100 mg total) by mouth 3 (three) times daily., Disp: 30 capsule, Rfl: 0 .  pantoprazole (PROTONIX) 40 MG tablet, TAKE 1 TABLET BY MOUTH EVERY DAY, Disp: 30 tablet, Rfl: 0 .  propranolol (INDERAL) 80 MG tablet, TAKE 1 TABLET BY MOUTH 3 TIMES A DAY, Disp: 90 tablet, Rfl: 0 .  sitaGLIPtin-metformin (JANUMET) 50-1000 MG tablet, Take 1 tablet by mouth 2 (two) times daily with a meal., Disp: 60 tablet, Rfl: 11 .  SUMAtriptan (IMITREX) 25 MG tablet, Take 1 tablet (25 mg total) by mouth daily. May take one more tablet two hours after the first. No more than two tablets per day., Disp: 30 tablet, Rfl: 0 .  topiramate (TOPAMAX) 25 MG tablet, Take 1 tablet (25 mg total) by mouth daily., Disp: 30 tablet, Rfl: 2   Allergies  Allergen Reactions  . Amlodipine Swelling  . Lisinopril Cough    Past Medical History:  Diagnosis Date  . Acid indigestion 09/07/2016  . Anemia 10/24/2016  . Diabetes (HCC)   . HTN (hypertension)      Past Surgical History:  Procedure Laterality Date  . BREAST BIOPSY Left 2014   BENIGN  . CESAREAN SECTION WITH BILATERAL TUBAL LIGATION  2015    Objective:   Vitals: BP 130/85 (BP Location: Right Arm)   Pulse 94  Temp 98.3 F (36.8 C) (Oral)   Resp 17   SpO2 98%   Physical Exam  Constitutional: She is oriented to person, place, and time. She appears well-developed and well-nourished.  HENT:  Right Ear: Tympanic membrane normal.  Left Ear: Tympanic membrane normal.  Nose: No mucosal edema or sinus tenderness.  Mouth/Throat: Oropharynx is clear and moist.  Eyes: Pupils are equal, round, and reactive to light. EOM are normal. Right eye exhibits no discharge. Left eye exhibits no discharge. No scleral icterus.  Neck: Normal range of motion. Neck supple.  Cardiovascular: Normal rate, regular rhythm and intact distal pulses. Exam reveals no gallop and no friction  rub.  No murmur heard. Pulmonary/Chest: No respiratory distress. She has no wheezes. She has no rales.  Neurological: She is alert and oriented to person, place, and time. She displays normal reflexes. No cranial nerve deficit. Coordination normal.  Skin: Skin is warm and dry.  Psychiatric: She has a normal mood and affect.   Assessment and Plan :   Other migraine without status migrainosus, not intractable - Plan: SUMAtriptan (IMITREX) 25 MG tablet  Nausea without vomiting  Photophobia  Dizziness  Essential hypertension  Environmental allergies  Refilled her Imitrex, provided patient with prescription for Zofran for nausea.  Counseled on appropriate sleep hygiene and headache management.  Counseled on lifestyle changes.   Wallis Bamberg, [place] 09/04/17 2113

## 2017-09-08 ENCOUNTER — Telehealth (INDEPENDENT_AMBULATORY_CARE_PROVIDER_SITE_OTHER): Payer: Self-pay | Admitting: Physician Assistant

## 2017-09-08 ENCOUNTER — Other Ambulatory Visit (INDEPENDENT_AMBULATORY_CARE_PROVIDER_SITE_OTHER): Payer: Self-pay | Admitting: Physician Assistant

## 2017-09-08 NOTE — Telephone Encounter (Signed)
[facility] called to request a refill on -propranolol (INDERAL) 80 MG tablet  Please follow up

## 2017-09-08 NOTE — Telephone Encounter (Signed)
Daniel at [facility] aware that propranolol is denied due to patients low heart rate. Maryjean Morn, CMA

## 2017-09-08 NOTE — Telephone Encounter (Signed)
Propranolol will be denied since her heart rate is too low.

## 2017-09-25 ENCOUNTER — Ambulatory Visit (INDEPENDENT_AMBULATORY_CARE_PROVIDER_SITE_OTHER): Payer: Self-pay | Admitting: Physician Assistant

## 2017-09-25 ENCOUNTER — Ambulatory Visit (INDEPENDENT_AMBULATORY_CARE_PROVIDER_SITE_OTHER): Payer: Self-pay | Admitting: Nurse Practitioner

## 2017-10-09 ENCOUNTER — Other Ambulatory Visit (INDEPENDENT_AMBULATORY_CARE_PROVIDER_SITE_OTHER): Payer: Self-pay | Admitting: Physician Assistant

## 2017-10-09 DIAGNOSIS — Z760 Encounter for issue of repeat prescription: Secondary | ICD-10-CM

## 2017-10-09 NOTE — Telephone Encounter (Signed)
FWD to PCP. Katherine Barr S Barbar Brede, CMA  

## 2017-11-11 ENCOUNTER — Other Ambulatory Visit (INDEPENDENT_AMBULATORY_CARE_PROVIDER_SITE_OTHER): Payer: Self-pay | Admitting: Physician Assistant

## 2017-11-11 DIAGNOSIS — K30 Functional dyspepsia: Secondary | ICD-10-CM

## 2017-11-13 NOTE — Telephone Encounter (Signed)
FWD TO PCP. Tempestt S Roberts, CMA  

## 2017-11-26 ENCOUNTER — Emergency Department (HOSPITAL_COMMUNITY)
Admission: EM | Admit: 2017-11-26 | Discharge: 2017-11-26 | Disposition: A | Discharge status: Home / Self Care | Payer: Medicaid Other | Attending: Emergency Medicine | Admitting: Emergency Medicine

## 2017-11-26 ENCOUNTER — Encounter (HOSPITAL_COMMUNITY): Payer: Self-pay

## 2017-11-26 ENCOUNTER — Ambulatory Visit (HOSPITAL_COMMUNITY)
Admission: EM | Admit: 2017-11-26 | Discharge: 2017-11-26 | Disposition: A | Discharge status: Home / Self Care | Payer: Medicaid Other | Attending: Family Medicine | Admitting: Family Medicine

## 2017-11-26 ENCOUNTER — Other Ambulatory Visit: Payer: Self-pay

## 2017-11-26 ENCOUNTER — Encounter (HOSPITAL_COMMUNITY): Payer: Self-pay | Admitting: Emergency Medicine

## 2017-11-26 ENCOUNTER — Emergency Department (HOSPITAL_COMMUNITY): Payer: Medicaid Other

## 2017-11-26 DIAGNOSIS — R1032 Left lower quadrant pain: Secondary | ICD-10-CM | POA: Diagnosis present

## 2017-11-26 DIAGNOSIS — E119 Type 2 diabetes mellitus without complications: Secondary | ICD-10-CM | POA: Insufficient documentation

## 2017-11-26 DIAGNOSIS — R109 Unspecified abdominal pain: Secondary | ICD-10-CM | POA: Diagnosis not present

## 2017-11-26 DIAGNOSIS — I10 Essential (primary) hypertension: Secondary | ICD-10-CM | POA: Insufficient documentation

## 2017-11-26 DIAGNOSIS — Z79899 Other long term (current) drug therapy: Secondary | ICD-10-CM | POA: Diagnosis not present

## 2017-11-26 DIAGNOSIS — N200 Calculus of kidney: Secondary | ICD-10-CM

## 2017-11-26 LAB — BASIC METABOLIC PANEL
Anion gap: 10 (ref 5–15)
BUN: 7 mg/dL (ref 6–20)
CALCIUM: 9.4 mg/dL (ref 8.9–10.3)
CO2: 24 mmol/L (ref 22–32)
CREATININE: 0.68 mg/dL (ref 0.44–1.00)
Chloride: 104 mmol/L (ref 98–111)
GFR calc non Af Amer: >60 mL/min (ref >60)
Glucose, Bld: 118 mg/dL — ABNORMAL HIGH (ref 70–99)
Potassium: 3.9 mmol/L (ref 3.5–5.1)
SODIUM: 138 mmol/L (ref 135–145)

## 2017-11-26 LAB — POCT URINALYSIS DIP (DEVICE)
BILIRUBIN URINE: NEGATIVE
Glucose, UA: 100 mg/dL — AB
KETONES UR: NEGATIVE mg/dL
NITRITE: NEGATIVE
PH: 6.5 (ref 5.0–8.0)
Protein, ur: NEGATIVE mg/dL
Specific Gravity, Urine: 1.01 (ref 1.005–1.030)
Urobilinogen, UA: 0.2 mg/dL (ref 0.0–1.0)

## 2017-11-26 LAB — CBC WITH DIFFERENTIAL/PLATELET
ABS IMMATURE GRANULOCYTES: 0.1 10*3/uL (ref 0.0–0.1)
BASOS PCT: 0 %
Basophils Absolute: 0 10*3/uL (ref 0.0–0.1)
Eosinophils Absolute: 0.2 10*3/uL (ref 0.0–0.7)
Eosinophils Relative: 2 %
HEMATOCRIT: 34.3 % — AB (ref 36.0–46.0)
Hemoglobin: 10.3 g/dL — ABNORMAL LOW (ref 12.0–15.0)
IMMATURE GRANULOCYTES: 1 %
LYMPHS ABS: 2.6 10*3/uL (ref 0.7–4.0)
Lymphocytes Relative: 25 %
MCH: 23.3 pg — AB (ref 26.0–34.0)
MCHC: 30 g/dL (ref 30.0–36.0)
MCV: 77.6 fL — AB (ref 78.0–100.0)
MONO ABS: 0.6 10*3/uL (ref 0.1–1.0)
MONOS PCT: 6 %
NEUTROS ABS: 6.9 10*3/uL (ref 1.7–7.7)
Neutrophils Relative %: 66 %
Platelets: 345 10*3/uL (ref 150–400)
RBC: 4.42 MIL/uL (ref 3.87–5.11)
RDW: 14.6 % (ref 11.5–15.5)
WBC: 10.4 10*3/uL (ref 4.0–10.5)

## 2017-11-26 LAB — URINALYSIS, ROUTINE W REFLEX MICROSCOPIC
Bilirubin Urine: NEGATIVE
Glucose, UA: 50 mg/dL — AB
KETONES UR: NEGATIVE mg/dL
Leukocytes, UA: NEGATIVE
Nitrite: NEGATIVE
PROTEIN: NEGATIVE mg/dL
Specific Gravity, Urine: 1.004 — ABNORMAL LOW (ref 1.005–1.030)
pH: 6 (ref 5.0–8.0)

## 2017-11-26 LAB — POC URINE PREG, ED: Preg Test, Ur: NEGATIVE

## 2017-11-26 MED ORDER — SODIUM CHLORIDE 0.9 % IV BOLUS
1000.0000 mL | Freq: Once | INTRAVENOUS | Status: AC
  Administered 2017-11-26: 1000 mL via INTRAVENOUS

## 2017-11-26 MED ORDER — KETOROLAC TROMETHAMINE 30 MG/ML IJ SOLN
30.0000 mg | Freq: Once | INTRAMUSCULAR | Status: AC
  Administered 2017-11-26: 30 mg via INTRAVENOUS
  Filled 2017-11-26: qty 1

## 2017-11-26 MED ORDER — ONDANSETRON 4 MG PO TBDP: 4.0000 mg | ORAL_TABLET | Freq: Three times a day (TID) | ORAL | 0 refills | Status: DC | PRN

## 2017-11-26 MED ORDER — OXYCODONE-ACETAMINOPHEN 5-325 MG PO TABS: 1.0000 | ORAL_TABLET | Freq: Three times a day (TID) | ORAL | 0 refills | Status: DC | PRN

## 2017-11-26 MED ORDER — OXYCODONE-ACETAMINOPHEN 5-325 MG PO TABS
1.0000 | ORAL_TABLET | ORAL | Status: DC | PRN
  Administered 2017-11-26: 1 via ORAL
  Filled 2017-11-26: qty 1

## 2017-11-26 NOTE — Discharge Instructions (Addendum)
Urine did show signs of infection, but based on presentation of symptoms I am recommending further evaluation and management in the ED Patient aware and in agreement with this plan.  Discharged in stable condition to ED

## 2017-11-26 NOTE — ED Triage Notes (Signed)
Pt states she has kidney stone and burning with urination. Pt reports pain began this morning. She took aleve at 0800 without relief. Pt afebrile in triage.

## 2017-11-26 NOTE — ED Triage Notes (Signed)
The patient presented [facility] with a complaint of "bladder" pain and dysuria that started this am.

## 2017-11-26 NOTE — ED Notes (Signed)
Patient given discharge instructions and verbalized understanding.  Patient stable to discharge at this time.  Patient is alert and oriented to baseline.  No distressed noted at this time.  All belongings taken with the patient at discharge.   

## 2017-11-26 NOTE — ED Provider Notes (Signed)
[facility]   SUBJECTIVE:  Katherine Barr is a 42 y.o. female who complains of urinary frequency, urgency, dysuria, left-side flank pain that began this AM.  Patient denies a precipitating event, recent sexual encounter, excessive caffeine intake, or prolonged period of time without using the restroom.  Localizes the pain to the left lower abdomen and flank.  Pain is constant and describes it as sharp.  Has tried aleve without relief.  Symptoms are made worse with urination.  Denies similar symptoms in the past.  Complains of associated nausea, and vaginal pain.  Denies fever, chills, vomiting, abnormal vaginal discharge or bleeding, hematuria.    LMP: No LMP recorded.  ROS: As in HPI.  Past Medical History:  Diagnosis Date  . Acid indigestion 09/07/2016  . Anemia 10/24/2016  . Diabetes (HCC)   . HTN (hypertension)    Past Surgical History:  Procedure Laterality Date  . BREAST BIOPSY Left 2014   BENIGN  . CESAREAN SECTION WITH BILATERAL TUBAL LIGATION  2015   Allergies  Allergen Reactions  . Amlodipine Swelling  . Lisinopril Cough   No current facility-administered medications on file prior to encounter.    Current Outpatient Medications on File Prior to Encounter  Medication Sig Dispense Refill  . atorvastatin (LIPITOR) 10 MG tablet TAKE 1 TABLET BY MOUTH EVERY DAY 90 tablet 0  . glimepiride (AMARYL) 4 MG tablet Take 1 tablet (4 mg total) by mouth daily before breakfast. 30 tablet 5  . MEGARED OMEGA-3 KRILL OIL 500 MG CAPS Take 1 capsule by mouth daily. 30 capsule 5  . pantoprazole (PROTONIX) 40 MG tablet TAKE 1 TABLET BY MOUTH EVERY DAY 90 tablet 1  . propranolol (INDERAL) 80 MG tablet TAKE 1 TABLET BY MOUTH 3 TIMES A DAY 90 tablet 0  . sitaGLIPtin-metformin (JANUMET) 50-1000 MG tablet Take 1 tablet by mouth 2 (two) times daily with a meal. 60 tablet 11   Social History   Socioeconomic History  . Marital status: Legally Separated    Spouse name: Not on file    . Number of children: Not on file  . Years of education: Not on file  . Highest education level: Not on file  Occupational History  . Not on file  Social Needs  . Financial resource strain: Not on file  . Food insecurity:    Worry: Not on file    Inability: Not on file  . Transportation needs:    Medical: Not on file    Non-medical: Not on file  Tobacco Use  . Smoking status: Never Smoker  . Smokeless tobacco: Never Used  Substance and Sexual Activity  . Alcohol use: No    Frequency: Never  . Drug use: No  . Sexual activity: Yes    Partners: Male  Lifestyle  . Physical activity:    Days per week: Not on file    Minutes per session: Not on file  . Stress: Not on file  Relationships  . Social connections:    Talks on phone: Not on file    Gets together: Not on file    Attends religious service: Not on file    Active member of club or organization: Not on file    Attends meetings of clubs or organizations: Not on file    Relationship status: Not on file  . Intimate partner violence:    Fear of current or ex partner: Not on file    Emotionally abused: Not on file  Physically abused: Not on file    Forced sexual activity: Not on file  Other Topics Concern  . Not on file  Social History Narrative  . Not on file   History reviewed. No pertinent family history.  OBJECTIVE:  Vitals:   11/26/17 1152  BP: (!) 175/90  Pulse: 64  Resp: 18  Temp: 98.1 F (36.7 C)  TempSrc: Oral  SpO2: 100%   General appearance: alert; appears uncomfortable; pacing around room HEENT: NCAT.  Oropharynx clear.  Lungs: clear to auscultation bilaterally without adventitious breath sounds Heart: regular rate and rhythm.  Radial pulses 2+ symmetrical bilaterally Abdomen: Tearful during e; soft; non-distended; diffusely tender about the LLQ and suprapubic region; bowel sounds present; guarding; no rebound tenderness Back: LT-sided CVA tenderness Extremities: no edema; symmetrical with  no gross deformities Skin: warm and dry Neurologic: Ambulates from chair to exam table without difficulty Psychological: alert and cooperative; normal mood and affect  Labs Reviewed  POCT URINALYSIS DIP (DEVICE) - Abnormal; Notable for the following components:      Result Value   Glucose, UA 100 (*)    Hgb urine dipstick SMALL (*)    Leukocytes, UA TRACE (*)    All other components within normal limits    ASSESSMENT & PLAN:  1. Left flank pain   2. Abdominal pain, left lower quadrant     No orders of the defined types were placed in this encounter.  Urine did show signs of infection, but based on presentation of symptoms I am recommending further evaluation and management in the ED Patient aware and in agreement with this plan.  Discharged in stable condition to ED  Outlined signs and symptoms indicating need for more acute intervention. Patient verbalized understanding. After Visit Summary given.     Katherine Harding, PA-C 11/26/17 1318

## 2017-11-26 NOTE — ED Provider Notes (Signed)
[facility] EMERGENCY DEPARTMENT Provider Note   CSN: [id number] Arrival date & time: 11/26/17  1309     History   Chief Complaint Chief Complaint  Patient presents with  . Flank Pain    HPI Katherine Barr is a 42 y.o. female with past medical history of diabetes, hypertension, kidney stones, who presents to ED for evaluation of acute onset left-sided flank pain radiating to her left lower abdomen since this morning.  No improvement with 1 dose of Aleve at 8 AM this morning.  Reports nausea with no vomiting.  Had a normal bowel movement today.  States that it feels similar to her prior kidney stone but she is having some dysuria.  She was sent here from urgent care for further work-up.  Last kidney stone was diagnosed 3 to 4 years ago and passed without difficulty.  She denies any vaginal complaints, fever, hematochezia or melena, possibility of pregnancy.  HPI  Past Medical History:  Diagnosis Date  . Acid indigestion 09/07/2016  . Anemia 10/24/2016  . Diabetes (HCC)   . HTN (hypertension)     Patient Active Problem List   Diagnosis Date Noted  . Insomnia 10/24/2016  . Anemia 10/24/2016  . Diabetes (HCC) 09/07/2016  . HTN (hypertension) 09/07/2016  . Acid indigestion 09/07/2016    Past Surgical History:  Procedure Laterality Date  . BREAST BIOPSY Left 2014   BENIGN  . CESAREAN SECTION WITH BILATERAL TUBAL LIGATION  2015     OB History    Gravida  2   Para  2   Term  2   Preterm      AB      Living  2     SAB      TAB      Ectopic      Multiple      Live Births  2            Home Medications    Prior to Admission medications   Medication Sig Start Date End Date Taking? Authorizing Provider  atorvastatin (LIPITOR) 10 MG tablet TAKE 1 TABLET BY MOUTH EVERY DAY 10/09/17   Loletta Specter, PA-C  glimepiride (AMARYL) 4 MG tablet Take 1 tablet (4 mg total) by mouth daily before breakfast. 07/05/17   Loletta Specter, PA-C    MEGARED OMEGA-3 KRILL OIL 500 MG CAPS Take 1 capsule by mouth daily. 09/21/16   Loletta Specter, PA-C  ondansetron (ZOFRAN ODT) 4 MG disintegrating tablet Take 1 tablet (4 mg total) by mouth every 8 (eight) hours as needed for nausea or vomiting. 11/26/17   Keelynn Furgerson, PA-C  oxyCODONE-acetaminophen (PERCOCET/ROXICET) 5-325 MG tablet Take 1 tablet by mouth every 8 (eight) hours as needed for severe pain. 11/26/17   Dietrich Pates, PA-C  pantoprazole (PROTONIX) 40 MG tablet TAKE 1 TABLET BY MOUTH EVERY DAY 11/13/17   Loletta Specter, PA-C  propranolol (INDERAL) 80 MG tablet TAKE 1 TABLET BY MOUTH 3 TIMES A DAY 08/23/17   Claiborne Rigg, NP  sitaGLIPtin-metformin (JANUMET) 50-1000 MG tablet Take 1 tablet by mouth 2 (two) times daily with a meal. 09/22/16   Loletta Specter, PA-C    Family History History reviewed. No pertinent family history.  Social History Social History   Tobacco Use  . Smoking status: Never Smoker  . Smokeless tobacco: Never Used  Substance Use Topics  . Alcohol use: No    Frequency: Never  . Drug use: No  Allergies   Amlodipine and Lisinopril   Review of Systems Review of Systems  Constitutional: Negative for appetite change, chills and fever.  HENT: Negative for ear pain, rhinorrhea, sneezing and sore throat.   Eyes: Negative for photophobia and visual disturbance.  Respiratory: Negative for cough, chest tightness, shortness of breath and wheezing.   Cardiovascular: Negative for chest pain and palpitations.  Gastrointestinal: Positive for abdominal pain and nausea. Negative for blood in stool, constipation, diarrhea and vomiting.  Genitourinary: Positive for dysuria and flank pain. Negative for hematuria and urgency.  Musculoskeletal: Negative for myalgias.  Skin: Negative for rash.  Neurological: Negative for dizziness, weakness and light-headedness.     Physical Exam Updated Vital Signs BP (!) 198/92 (BP Location: Left Arm)   Pulse 65    Temp 98.2 F (36.8 C) (Oral)   Resp 14   Ht 5\' 1"  (1.549 m)   Wt 66.2 kg (146 lb)   SpO2 100%   BMI 27.59 kg/m   Physical Exam  Constitutional: She appears well-developed and well-nourished. No distress.  HENT:  Head: Normocephalic and atraumatic.  Nose: Nose normal.  Eyes: Conjunctivae and EOM are normal. Left eye exhibits no discharge. No scleral icterus.  Neck: Normal range of motion. Neck supple.  Cardiovascular: Normal rate, regular rhythm, normal heart sounds and intact distal pulses. Exam reveals no gallop and no friction rub.  No murmur heard. Pulmonary/Chest: Effort normal and breath sounds normal. No respiratory distress.  Abdominal: Soft. Bowel sounds are normal. She exhibits no distension. There is tenderness (Left CVA, left flank, left lower quadrant). There is no guarding.  Musculoskeletal: Normal range of motion. She exhibits no edema.  Neurological: She is alert. She exhibits normal muscle tone. Coordination normal.  Skin: Skin is warm and dry. No rash noted.  Psychiatric: She has a normal mood and affect.  Nursing note and vitals reviewed.    ED Treatments / Results  Labs (all labs ordered are listed, but only abnormal results are displayed) Labs Reviewed  URINALYSIS, ROUTINE W REFLEX MICROSCOPIC - Abnormal; Notable for the following components:      Result Value   Color, Urine STRAW (*)    Specific Gravity, Urine 1.004 (*)    Glucose, UA 50 (*)    Hgb urine dipstick MODERATE (*)    Bacteria, UA RARE (*)    All other components within normal limits  BASIC METABOLIC PANEL - Abnormal; Notable for the following components:   Glucose, Bld 118 (*)    All other components within normal limits  CBC WITH DIFFERENTIAL/PLATELET - Abnormal; Notable for the following components:   Hemoglobin 10.3 (*)    HCT 34.3 (*)    MCV 77.6 (*)    MCH 23.3 (*)    All other components within normal limits  URINE CULTURE  POC URINE PREG, ED    EKG None  Radiology Ct  Renal Stone Study  Result Date: 11/26/2017 CLINICAL DATA:  Acute onset of left flank and lower quadrant pain this morning. EXAM: CT ABDOMEN AND PELVIS WITHOUT CONTRAST TECHNIQUE: Multidetector CT imaging of the abdomen and pelvis was performed following the standard protocol without IV contrast. COMPARISON:  None. FINDINGS: Lower chest: No acute findings. Hepatobiliary: Mild-to-moderate hepatic steatosis. No mass seen in visualized portion of liver on this unenhanced exam. Gallbladder is unremarkable. Pancreas: No mass or inflammatory process visualized on this unenhanced exam. Spleen:  Within normal limits in size. Adrenals/Urinary tract: Several tiny 2-3 mm calculi are seen in both kidneys. Mild  left hydroureteronephrosis is seen due to a 5 mm distal ureteral calculus at the left ureterovesical junction. Mild left renal swelling and perinephric stranding also noted. Stomach/Bowel: No evidence of obstruction, inflammatory process, or abnormal fluid collections. Normal appendix visualized. Vascular/Lymphatic: No pathologically enlarged lymph nodes identified. No evidence of abdominal aortic aneurysm. Aortic atherosclerosis. Reproductive:  No mass or other significant abnormality. Other:  None. Musculoskeletal:  No suspicious bone lesions identified. IMPRESSION: Mild left hydroureteronephrosis due to 5 mm distal ureteral calculus at the left ureterovesical junction. Hepatic steatosis. Electronically Signed   By: Myles RosenthalJohn  Stahl M.D.   On: 11/26/2017 16:21    Procedures Procedures (including critical care time)  Medications Ordered in ED Medications  oxyCODONE-acetaminophen (PERCOCET/ROXICET) 5-325 MG per tablet 1 tablet (1 tablet Oral Given 11/26/17 1329)  ketorolac (TORADOL) 30 MG/ML injection 30 mg (30 mg Intravenous Given 11/26/17 1707)  sodium chloride 0.9 % bolus 1,000 mL (1,000 mLs Intravenous New Bag/Given 11/26/17 1707)     Initial Impression / Assessment and Plan / ED Course  I have reviewed the  triage vital signs and the nursing notes.  Pertinent labs & imaging results that were available during my care of the patient were reviewed by me and considered in my medical decision making (see chart for details).     42 year old female with a past medical history of hypertension, diabetes, prior kidney stones presents to ED for evaluation of left-sided flank pain since waking up this morning.  Pain radiates to her left lower quadrant.  States that it feels similar to her prior kidney stones.  Physical exam she is overall well-appearing.  Left-sided flank and CVA tenderness.  Urinalysis shows rare bacteria but otherwise negative for infection.  BMP, CBC unremarkable.  Urine pregnancy is negative.  CT renal stone study shows 5 mm UVJ stone on the left side.  Patient given pain medication, fluids with significant improvement in her symptoms.  She feels that she is comfortable for discharge home with p.o. pain medication and urology follow-up symptoms do not improve.  She remains afebrile here.  Advised to return to ED for any severe worsening symptoms.  Portions of this note were generated with Scientist, clinical (histocompatibility and immunogenetics)Dragon dictation software. Dictation errors may occur despite best attempts at proofreading.   Final Clinical Impressions(s) / ED Diagnoses   Final diagnoses:  Nephrolithiasis    ED Discharge Orders        Ordered    oxyCODONE-acetaminophen (PERCOCET/ROXICET) 5-325 MG tablet  Every 8 hours PRN     11/26/17 1817    ondansetron (ZOFRAN ODT) 4 MG disintegrating tablet  Every 8 hours PRN     11/26/17 1817       Dietrich PatesKhatri, Rainen Vanrossum, PA-C 11/26/17 1819    Tegeler, Canary Brimhristopher J, MD 11/27/17 [phone]

## 2017-11-26 NOTE — Discharge Instructions (Signed)
Return to ED if you start to develop fever, worsening pain, vomiting up blood, severe abdominal pain. Follow-up with the urologist listed below if your symptoms do not improve in 4 to 5 days.

## 2017-11-28 LAB — URINE CULTURE

## 2017-12-14 LAB — HM DIABETES EYE EXAM

## 2017-12-26 ENCOUNTER — Other Ambulatory Visit (INDEPENDENT_AMBULATORY_CARE_PROVIDER_SITE_OTHER): Payer: Self-pay | Admitting: Physician Assistant

## 2017-12-26 DIAGNOSIS — E119 Type 2 diabetes mellitus without complications: Secondary | ICD-10-CM

## 2017-12-27 NOTE — Telephone Encounter (Signed)
FWD to PCP. Tempestt S Roberts, CMA  

## 2017-12-29 ENCOUNTER — Other Ambulatory Visit (INDEPENDENT_AMBULATORY_CARE_PROVIDER_SITE_OTHER): Payer: Self-pay | Admitting: Physician Assistant

## 2017-12-29 DIAGNOSIS — E119 Type 2 diabetes mellitus without complications: Secondary | ICD-10-CM

## 2017-12-29 NOTE — Telephone Encounter (Signed)
FWD to PCP. Tempestt S Roberts, CMA  

## 2018-01-01 ENCOUNTER — Other Ambulatory Visit: Payer: Self-pay

## 2018-01-01 ENCOUNTER — Ambulatory Visit (INDEPENDENT_AMBULATORY_CARE_PROVIDER_SITE_OTHER): Payer: Medicaid Other | Admitting: Physician Assistant

## 2018-01-01 ENCOUNTER — Encounter (INDEPENDENT_AMBULATORY_CARE_PROVIDER_SITE_OTHER): Payer: Self-pay | Admitting: Physician Assistant

## 2018-01-01 VITALS — BP 124/78 | HR 64 | Temp 97.4°F | Ht 61.0 in | Wt 147.8 lb

## 2018-01-01 DIAGNOSIS — E119 Type 2 diabetes mellitus without complications: Secondary | ICD-10-CM | POA: Diagnosis not present

## 2018-01-01 DIAGNOSIS — K219 Gastro-esophageal reflux disease without esophagitis: Secondary | ICD-10-CM | POA: Diagnosis not present

## 2018-01-01 DIAGNOSIS — Z760 Encounter for issue of repeat prescription: Secondary | ICD-10-CM

## 2018-01-01 DIAGNOSIS — S161XXA Strain of muscle, fascia and tendon at neck level, initial encounter: Secondary | ICD-10-CM

## 2018-01-01 LAB — POCT GLYCOSYLATED HEMOGLOBIN (HGB A1C): Hemoglobin A1C: 7.1 % — AB (ref 4.0–5.6)

## 2018-01-01 MED ORDER — PROPRANOLOL HCL 80 MG PO TABS: 80.0000 mg | ORAL_TABLET | Freq: Three times a day (TID) | ORAL | 1 refills | Status: DC

## 2018-01-01 MED ORDER — DEXLANSOPRAZOLE 60 MG PO CPDR: 60.0000 mg | DELAYED_RELEASE_CAPSULE | Freq: Every day | ORAL | 5 refills | Status: DC

## 2018-01-01 MED ORDER — ATORVASTATIN CALCIUM 10 MG PO TABS: 10.0000 mg | ORAL_TABLET | Freq: Every day | ORAL | 1 refills | Status: DC

## 2018-01-01 MED ORDER — SITAGLIPTIN PHOS-METFORMIN HCL 50-1000 MG PO TABS: 1.0000 | ORAL_TABLET | Freq: Two times a day (BID) | ORAL | 5 refills | Status: DC

## 2018-01-01 MED ORDER — GLIMEPIRIDE 4 MG PO TABS: 4.0000 mg | ORAL_TABLET | Freq: Every day | ORAL | 1 refills | Status: DC

## 2018-01-01 MED ORDER — CYCLOBENZAPRINE HCL 10 MG PO TABS: 10.0000 mg | ORAL_TABLET | Freq: Every day | ORAL | 0 refills | Status: DC

## 2018-01-01 NOTE — Progress Notes (Signed)
Subjective:  Patient ID: Katherine Barr, female    DOB: 07/02/1975  Age: 42 y.o. MRN: [id number]  CC: f/u DM  HPI  Lunabella N Patelis a 41 y.o.femalewith a PMH of HTN, HLD, DM2, anemia, GERD, and s/p BTLpresents for DM. Last A1c 7.1% six months ago. A1c 7.1% today. Unhappy she is gaining weight and centrally obese. Says she has no time to exercise. Says she eats once per day. Takes care of her children and her in-laws. Also works as Armed forces operational officer. Does not endorse any typical diabetic/hyperglycemia symptoms. Only complaint is of right sided neck pain along the distribution of the SCM. Pain only present when opening mouth wide/yawning. No history of injury, swelling, redness, fever, chills, or limited aROM of the neck.    Outpatient Medications Prior to Visit  Medication Sig Dispense Refill  . atorvastatin (LIPITOR) 10 MG tablet TAKE 1 TABLET BY MOUTH EVERY DAY 90 tablet 0  . glimepiride (AMARYL) 4 MG tablet TAKE 1 TABLET (4 MG TOTAL) BY MOUTH DAILY BEFORE BREAKFAST. 90 tablet 1  . JANUMET 50-1000 MG tablet TAKE 1 TABLET BY MOUTH TWICE A DAY WITH MEALS 60 tablet 4  . MEGARED OMEGA-3 KRILL OIL 500 MG CAPS Take 1 capsule by mouth daily. 30 capsule 5  . ondansetron (ZOFRAN ODT) 4 MG disintegrating tablet Take 1 tablet (4 mg total) by mouth every 8 (eight) hours as needed for nausea or vomiting. 5 tablet 0  . oxyCODONE-acetaminophen (PERCOCET/ROXICET) 5-325 MG tablet Take 1 tablet by mouth every 8 (eight) hours as needed for severe pain. 6 tablet 0  . pantoprazole (PROTONIX) 40 MG tablet TAKE 1 TABLET BY MOUTH EVERY DAY 90 tablet 1  . propranolol (INDERAL) 80 MG tablet TAKE 1 TABLET BY MOUTH 3 TIMES A DAY 90 tablet 0   No facility-administered medications prior to visit.      ROS Review of Systems  Constitutional: Negative for chills, fever and malaise/fatigue.  Eyes: Negative for blurred vision.  Respiratory: Negative for shortness of breath.   Cardiovascular:  Negative for chest pain and palpitations.  Gastrointestinal: Negative for abdominal pain and nausea.  Genitourinary: Negative for dysuria and hematuria.  Musculoskeletal: Positive for neck pain. Negative for joint pain and myalgias.  Skin: Negative for rash.  Neurological: Negative for tingling and headaches.  Psychiatric/Behavioral: Negative for depression. The patient is not nervous/anxious.     Objective:  BP 124/78 (BP Location: Left Arm, Patient Position: Sitting, Cuff Size: Normal)   Pulse 64   Temp (!) 97.4 F (36.3 C) (Oral)   Ht 5\' 1"  (1.549 m)   Wt 147 lb 12.8 oz (67 kg)   LMP 12/14/2017 (Exact Date)   SpO2 99%   BMI 27.93 kg/m   BP/Weight 01/01/2018 11/26/2017 11/26/2017  Systolic BP 124 150 175  Diastolic BP 78 90 90  Wt. (Lbs) 147.8 146 -  BMI 27.93 27.59 -      Physical Exam  Constitutional: She is oriented to person, place, and time.  Well developed, well nourished, NAD, polite  HENT:  Head: Normocephalic and atraumatic.  Eyes: No scleral icterus.  Neck: Normal range of motion. Neck supple. No thyromegaly present.  Cardiovascular: Normal rate, regular rhythm and normal heart sounds.  Pulmonary/Chest: Effort normal and breath sounds normal.  Musculoskeletal: She exhibits no edema.  Neck with full aROM. Mild TTP along the distribution of the right SCM.  Neurological: She is alert and oriented to person, place, and time.  Skin: Skin  is warm and dry. No rash noted. No erythema. No pallor.  Psychiatric: She has a normal mood and affect. Her behavior is normal. Thought content normal.  Vitals reviewed.    Assessment & Plan:    1. Type 2 diabetes mellitus without complication, without long-term current use of insulin (HCC) - HgB A1c 7.1% - Refill sitaGLIPtin-metformin (JANUMET) 50-1000 MG tablet; Take 1 tablet by mouth 2 (two) times daily with a meal.  Dispense: 60 tablet; Refill: 5 - Refill glimepiride (AMARYL) 4 MG tablet; Take 1 tablet (4 mg total) by  mouth daily before breakfast.  Dispense: 90 tablet; Refill: 1 - Lipid panel; Future - Comprehensive metabolic panel; Future - Microalbumin/creatinine ratio; Future  2. Gastroesophageal reflux disease, esophagitis presence not specified - Begin dexlansoprazole (DEXILANT) 60 MG capsule; Take 1 capsule (60 mg total) by mouth daily.  Dispense: 30 capsule; Refill: 5  3. Strain of neck muscle, initial encounter - Begin cyclobenzaprine (FLEXERIL) 10 MG tablet; Take 1 tablet (10 mg total) by mouth at bedtime.  Dispense: 10 tablet; Refill: 0  4. Medication refill - Refill propranolol (INDERAL) 80 MG tablet; Take 1 tablet (80 mg total) by mouth 3 (three) times daily.  Dispense: 90 tablet; Refill: 1 - Refill atorvastatin (LIPITOR) 10 MG tablet; Take 1 tablet (10 mg total) by mouth daily.  Dispense: 90 tablet; Refill: 1   Meds ordered this encounter  Medications  . sitaGLIPtin-metformin (JANUMET) 50-1000 MG tablet    Sig: Take 1 tablet by mouth 2 (two) times daily with a meal.    Dispense:  60 tablet    Refill:  5    Order Specific Question:   Supervising Provider    Answer:   Hoy RegisterNEWLIN, ENOBONG [[id number]]  . glimepiride (AMARYL) 4 MG tablet    Sig: Take 1 tablet (4 mg total) by mouth daily before breakfast.    Dispense:  90 tablet    Refill:  1    Order Specific Question:   Supervising Provider    Answer:   Hoy RegisterNEWLIN, ENOBONG [[id number]]  . propranolol (INDERAL) 80 MG tablet    Sig: Take 1 tablet (80 mg total) by mouth 3 (three) times daily.    Dispense:  90 tablet    Refill:  1    Needs office visit to check BP and HR due to recent office visit with heart rate 51    Order Specific Question:   Supervising Provider    Answer:   Hoy RegisterNEWLIN, ENOBONG [[id number]]  . atorvastatin (LIPITOR) 10 MG tablet    Sig: Take 1 tablet (10 mg total) by mouth daily.    Dispense:  90 tablet    Refill:  1    Order Specific Question:   Supervising Provider    Answer:   Hoy RegisterNEWLIN, ENOBONG [[id number]]  . dexlansoprazole (DEXILANT) 60 MG  capsule    Sig: Take 1 capsule (60 mg total) by mouth daily.    Dispense:  30 capsule    Refill:  5    Order Specific Question:   Supervising Provider    Answer:   Hoy RegisterNEWLIN, ENOBONG [[id number]]  . cyclobenzaprine (FLEXERIL) 10 MG tablet    Sig: Take 1 tablet (10 mg total) by mouth at bedtime.    Dispense:  10 tablet    Refill:  0    Order Specific Question:   Supervising Provider    Answer:   Hoy RegisterNEWLIN, ENOBONG [[id number]]    Follow-up: Return in about 6 months (around 07/04/2018), or DM.   Maura Crandalloger David  Altamease Oiler PA

## 2018-01-01 NOTE — Patient Instructions (Signed)
Dexlansoprazole capsules What is this medicine? DEXLANSOPRAZOLE (dex lan SOE pra zole) prevents the production of acid in the stomach. It is used to treat gastroesophageal reflux disease (GERD) and inflammation of the esophagus. This medicine may be used for other purposes; ask your health care provider or pharmacist if you have questions. COMMON BRAND NAME(S): Dexilant, Kapidex What should I tell my health care provider before I take this medicine? They need to know if you have any of these conditions: -liver disease -low levels of magnesium in the blood -lupus -an unusual or allergic reaction to dexlansoprazole, other medicines, foods, dyes, or preservatives -pregnant or trying to get pregnant -breast-feeding How should I use this medicine? Take this medicine by mouth. Swallow the capsules whole with a drink of water. Follow the directions on the prescription label. Do not crush or chew. Take your medicine at regular intervals. Do not take more often than directed. If you have difficulty swallowing the capsules, you may open the capsule and sprinkle the contents on a tablespoon of applesauce. Do not crush the contents of the capsule into the food. Swallow the dose immediately after preparing it. Do not chew. Follow with a drink of water. A special MedGuide will be given to you before each treatment. Be sure to read this information carefully each time. Talk to your pediatrician regarding the use of this medicine in children. While this drug may be prescribed for children as young as 12 years for selected conditions, precautions do apply. Overdosage: If you think you have taken too much of this medicine contact a poison control center or emergency room at once. NOTE: This medicine is only for you. Do not share this medicine with others. What if I miss a dose? If you miss a dose, take it as soon as you can. If it is almost time for your next dose, take only that dose. Do not take double or extra  doses. What may interact with this medicine? Do not take this medicine with any of the following medications: -nelfinavir -rilpivirine -St. John's Wort This medicine may also interact with the following medications: -certain antiviral medicines for HIV or AIDS like atazanavir, saquinavir, ritonavir -certain medicines for fungal infections like ketoconazole, itraconazole, voriconazole -dasatinib -digoxin -erlotinib -iron salts -methotrexate -mycophenolate mofetil -nilotinib -tacrolimus -warfarin This list may not describe all possible interactions. Give your health care provider a list of all the medicines, herbs, non-prescription drugs, or dietary supplements you use. Also tell them if you smoke, drink alcohol, or use illegal drugs. Some items may interact with your medicine. What should I watch for while using this medicine? It can take several days before your stomach pain gets better. Check with your doctor or health care professional if your condition does not start to get better, or if it gets worse. You may need blood work done while you are taking this medicine. What side effects may I notice from receiving this medicine? Side effects that you should report to your doctor or health care professional as soon as possible: -allergic reactions like skin rash, itching or hives, swelling of the face, lips, or tongue -bone, muscle or joint pain -breathing problems -chest pain or chest tightness -dark yellow or brown urine -dizziness -fast, irregular heartbeat -feeling faint or lightheaded -fever or sore throat -muscle spasm -palpitations -rash on cheeks or arms that gets worse in the sun -redness, blistering, peeling or loosening of the skin, including inside the mouth -seizures -tremors -unusual bleeding or bruising -unusually weak or tired -  yellowing of the eyes or skin Side effects that usually do not require medical attention (report to your doctor or health care  professional if they continue or are bothersome): -constipation -diarrhea -dry mouth -headache -nausea This list may not describe all possible side effects. Call your doctor for medical advice about side effects. You may report side effects to FDA at 1-800-FDA-1088. Where should I keep my medicine? Keep out of the reach of children. Store at room temperature between 15 and 30 degrees C (59 and 86 degrees F). Protect from moisture. Throw away any unused medicine after the expiration date. NOTE: This sheet is a summary. It may not cover all possible information. If you have questions about this medicine, talk to your doctor, pharmacist, or health care provider.  2018 Elsevier/Gold Standard (2015-05-27 16:22:08)  

## 2018-01-31 ENCOUNTER — Other Ambulatory Visit (INDEPENDENT_AMBULATORY_CARE_PROVIDER_SITE_OTHER): Payer: Self-pay | Admitting: Physician Assistant

## 2018-01-31 DIAGNOSIS — Z760 Encounter for issue of repeat prescription: Secondary | ICD-10-CM

## 2018-01-31 NOTE — Telephone Encounter (Signed)
FWD to PCP. Chirsty Armistead S Samanthamarie Ezzell, CMA  

## 2018-03-06 ENCOUNTER — Other Ambulatory Visit (INDEPENDENT_AMBULATORY_CARE_PROVIDER_SITE_OTHER): Payer: Self-pay | Admitting: Physician Assistant

## 2018-03-06 DIAGNOSIS — Z760 Encounter for issue of repeat prescription: Secondary | ICD-10-CM

## 2018-03-06 NOTE — Telephone Encounter (Signed)
FWD to PCP. Katherine Barr Katherine Barr, CMA  

## 2018-03-14 ENCOUNTER — Other Ambulatory Visit (INDEPENDENT_AMBULATORY_CARE_PROVIDER_SITE_OTHER): Payer: Self-pay | Admitting: Physician Assistant

## 2018-04-17 ENCOUNTER — Ambulatory Visit (INDEPENDENT_AMBULATORY_CARE_PROVIDER_SITE_OTHER): Payer: Medicaid Other | Admitting: Physician Assistant

## 2018-09-12 ENCOUNTER — Other Ambulatory Visit: Payer: Self-pay | Admitting: Physician Assistant

## 2018-09-12 ENCOUNTER — Other Ambulatory Visit: Payer: Self-pay

## 2018-09-12 DIAGNOSIS — Z1231 Encounter for screening mammogram for malignant neoplasm of breast: Secondary | ICD-10-CM

## 2018-09-19 ENCOUNTER — Other Ambulatory Visit (HOSPITAL_COMMUNITY): Payer: Self-pay | Admitting: *Deleted

## 2018-09-19 DIAGNOSIS — Z1231 Encounter for screening mammogram for malignant neoplasm of breast: Secondary | ICD-10-CM

## 2019-01-31 ENCOUNTER — Ambulatory Visit (HOSPITAL_COMMUNITY): Payer: Medicaid Other

## 2019-01-31 ENCOUNTER — Other Ambulatory Visit: Payer: Self-pay

## 2019-01-31 ENCOUNTER — Ambulatory Visit
Admission: RE | Admit: 2019-01-31 | Discharge: 2019-01-31 | Disposition: A | Discharge status: Home / Self Care | Payer: No Typology Code available for payment source | Source: Ambulatory Visit | Attending: Obstetrics and Gynecology | Admitting: Obstetrics and Gynecology

## 2019-01-31 DIAGNOSIS — Z1231 Encounter for screening mammogram for malignant neoplasm of breast: Secondary | ICD-10-CM

## 2019-12-04 ENCOUNTER — Other Ambulatory Visit: Payer: Self-pay

## 2019-12-04 DIAGNOSIS — Z1231 Encounter for screening mammogram for malignant neoplasm of breast: Secondary | ICD-10-CM

## 2020-02-03 ENCOUNTER — Ambulatory Visit
Admission: RE | Admit: 2020-02-03 | Discharge: 2020-02-03 | Disposition: A | Discharge status: Home / Self Care | Payer: No Typology Code available for payment source | Source: Ambulatory Visit | Attending: Family Medicine | Admitting: Family Medicine

## 2020-02-03 DIAGNOSIS — Z1231 Encounter for screening mammogram for malignant neoplasm of breast: Secondary | ICD-10-CM

## 2020-03-30 DIAGNOSIS — N912 Amenorrhea, unspecified: Secondary | ICD-10-CM | POA: Insufficient documentation

## 2020-08-05 ENCOUNTER — Other Ambulatory Visit: Payer: Self-pay

## 2020-08-05 ENCOUNTER — Ambulatory Visit
Admission: EM | Admit: 2020-08-05 | Discharge: 2020-08-05 | Disposition: A | Discharge status: Home / Self Care | Payer: 59 | Attending: Family Medicine | Admitting: Family Medicine

## 2020-08-05 ENCOUNTER — Encounter: Payer: Self-pay | Admitting: Emergency Medicine

## 2020-08-05 ENCOUNTER — Ambulatory Visit: Payer: Self-pay

## 2020-08-05 DIAGNOSIS — N898 Other specified noninflammatory disorders of vagina: Secondary | ICD-10-CM | POA: Diagnosis not present

## 2020-08-05 HISTORY — DX: Type 2 diabetes mellitus without complications: E11.9

## 2020-08-05 MED ORDER — FLUCONAZOLE 150 MG PO TABS: ORAL_TABLET | ORAL | 1 refills | Status: DC

## 2020-08-05 NOTE — ED Triage Notes (Signed)
Vaginal itching x 3 days.  denies discharge.  States she has this frequently.  States she has polyps in her uterus and has been taking mediation for that.

## 2020-08-05 NOTE — ED Provider Notes (Signed)
  [facility]   [id number] 08/05/20 Arrival Time: 1013  ASSESSMENT & PLAN:  1. Vaginal itching    Will tx empirically for yeast infection. Discussed.  Begin: Meds ordered this encounter  Medications  . fluconazole (DIFLUCAN) 150 MG tablet    Sig: Take one tablet by mouth as a single dose. May repeat in 3 days if symptoms persist.    Dispense:  2 tablet    Refill:  1      Discharge Instructions     Keep your gynecology follow up in April.  We have sent your vaginal swab for further testing. Your symptoms are consistent with a yeast infection. We will notify you of any positive results once they are received. If required, we will prescribe any medications you might need.     Without s/s of PID.  Pending: Labs Reviewed  CERVICOVAGINAL ANCILLARY ONLY    Will notify of any positive results. Instructed to refrain from sexual activity for at least seven days.  Reviewed expectations re: course of current medical issues. Questions answered. Outlined signs and symptoms indicating need for more acute intervention. Patient verbalized understanding. After Visit Summary given.   SUBJECTIVE:  Katherine Barr is a 45 y.o. female who presents with complaint of vaginal itching. Onset abrupt. First noticed 3 d ago. No discharge reported. Feels these symptoms are frequent; monthly. OTC yeast medicines with improvement. Patient's last menstrual period was 05/25/2020. No specific aggravating or alleviating factors reported. Denies: urinary frequency, dysuria and gross hematuria. Afebrile. No abdominal or pelvic pain. Normal PO intake wihout n/v. No genital rashes or lesions.  OBJECTIVE:  Vitals:   08/05/20 1024  BP: 135/85  Pulse: 85  Resp: 18  Temp: 98.4 F (36.9 C)  TempSrc: Oral  SpO2: 98%     General appearance: alert, cooperative, appears stated age and no distress Lungs: unlabored respirations; speaks full sentences without difficulty FROM at waist GU:  deferred Skin: warm and dry Psychological: alert and cooperative; normal mood and affect.    Labs Reviewed  CERVICOVAGINAL ANCILLARY ONLY    No Known Allergies  Past Medical History:  Diagnosis Date  . Diabetes mellitus without complication (HCC)    History reviewed. No pertinent family history. Social History   Socioeconomic History  . Marital status: Married    Spouse name: Not on file  . Number of children: Not on file  . Years of education: Not on file  . Highest education level: Not on file  Occupational History  . Not on file  Tobacco Use  . Smoking status: Never Smoker  . Smokeless tobacco: Never Used  Substance and Sexual Activity  . Alcohol use: Never  . Drug use: Never  . Sexual activity: Not on file  Other Topics Concern  . Not on file  Social History Narrative  . Not on file   Social Determinants of Health   Financial Resource Strain: Not on file  Food Insecurity: Not on file  Transportation Needs: Not on file  Physical Activity: Not on file  Stress: Not on file  Social Connections: Not on file  Intimate Partner Violence: Not on file          Mardella Layman, MD 08/05/20 1038

## 2020-08-05 NOTE — Discharge Instructions (Addendum)
Keep your gynecology follow up in April.  We have sent your vaginal swab for further testing. Your symptoms are consistent with a yeast infection. We will notify you of any positive results once they are received. If required, we will prescribe any medications you might need.

## 2020-08-06 LAB — CERVICOVAGINAL ANCILLARY ONLY
Bacterial Vaginitis (gardnerella): NEGATIVE
Candida Glabrata: NEGATIVE
Candida Vaginitis: POSITIVE — AB
Comment: NEGATIVE
Comment: NEGATIVE
Comment: NEGATIVE
Comment: NEGATIVE
Trichomonas: NEGATIVE

## 2020-08-25 ENCOUNTER — Ambulatory Visit (INDEPENDENT_AMBULATORY_CARE_PROVIDER_SITE_OTHER): Payer: 59 | Admitting: Nurse Practitioner

## 2020-08-25 ENCOUNTER — Other Ambulatory Visit: Payer: Self-pay

## 2020-08-25 ENCOUNTER — Other Ambulatory Visit (HOSPITAL_COMMUNITY)
Admission: RE | Admit: 2020-08-25 | Discharge: 2020-08-25 | Disposition: A | Discharge status: Home / Self Care | Payer: Medicaid Other | Source: Ambulatory Visit | Attending: Nurse Practitioner | Admitting: Nurse Practitioner

## 2020-08-25 ENCOUNTER — Encounter: Payer: Self-pay | Admitting: Nurse Practitioner

## 2020-08-25 VITALS — BP 140/88 | HR 88 | Resp 16 | Ht 60.5 in | Wt 136.0 lb

## 2020-08-25 DIAGNOSIS — Z01419 Encounter for gynecological examination (general) (routine) without abnormal findings: Secondary | ICD-10-CM | POA: Diagnosis not present

## 2020-08-25 DIAGNOSIS — N840 Polyp of corpus uteri: Secondary | ICD-10-CM

## 2020-08-25 DIAGNOSIS — N941 Unspecified dyspareunia: Secondary | ICD-10-CM

## 2020-08-25 LAB — WET PREP FOR TRICH, YEAST, CLUE

## 2020-08-25 NOTE — Patient Instructions (Signed)
Health Maintenance, Female Adopting a healthy lifestyle and getting preventive care are important in promoting health and wellness. Ask your health care provider about:  The right schedule for you to have regular tests and exams.  Things you can do on your own to prevent diseases and keep yourself healthy. What should I know about diet, weight, and exercise? Eat a healthy diet  Eat a diet that includes plenty of vegetables, fruits, low-fat dairy products, and lean protein.  Do not eat a lot of foods that are high in solid fats, added sugars, or sodium.   Maintain a healthy weight Body mass index (BMI) is used to identify weight problems. It estimates body fat based on height and weight. Your health care provider can help determine your BMI and help you achieve or maintain a healthy weight. Get regular exercise Get regular exercise. This is one of the most important things you can do for your health. Most adults should:  Exercise for at least 150 minutes each week. The exercise should increase your heart rate and make you sweat (moderate-intensity exercise).  Do strengthening exercises at least twice a week. This is in addition to the moderate-intensity exercise.  Spend less time sitting. Even light physical activity can be beneficial. Watch cholesterol and blood lipids Have your blood tested for lipids and cholesterol at 44 years of age, then have this test every 5 years. Have your cholesterol levels checked more often if:  Your lipid or cholesterol levels are high.  You are older than 44 years of age.  You are at high risk for heart disease. What should I know about cancer screening? Depending on your health history and family history, you may need to have cancer screening at various ages. This may include screening for:  Breast cancer.  Cervical cancer.  Colorectal cancer.  Skin cancer.  Lung cancer. What should I know about heart disease, diabetes, and high blood  pressure? Blood pressure and heart disease  High blood pressure causes heart disease and increases the risk of stroke. This is more likely to develop in people who have high blood pressure readings, are of African descent, or are overweight.  Have your blood pressure checked: ? Every 3-5 years if you are 18-39 years of age. ? Every year if you are 40 years old or older. Diabetes Have regular diabetes screenings. This checks your fasting blood sugar level. Have the screening done:  Once every three years after age 40 if you are at a normal weight and have a low risk for diabetes.  More often and at a younger age if you are overweight or have a high risk for diabetes. What should I know about preventing infection? Hepatitis B If you have a higher risk for hepatitis B, you should be screened for this virus. Talk with your health care provider to find out if you are at risk for hepatitis B infection. Hepatitis C Testing is recommended for:  Everyone born from 1945 through 1965.  Anyone with known risk factors for hepatitis C. Sexually transmitted infections (STIs)  Get screened for STIs, including gonorrhea and chlamydia, if: ? You are sexually active and are younger than 44 years of age. ? You are older than 44 years of age and your health care provider tells you that you are at risk for this type of infection. ? Your sexual activity has changed since you were last screened, and you are at increased risk for chlamydia or gonorrhea. Ask your health care provider   if you are at risk.  Ask your health care provider about whether you are at high risk for HIV. Your health care provider may recommend a prescription medicine to help prevent HIV infection. If you choose to take medicine to prevent HIV, you should first get tested for HIV. You should then be tested every 3 months for as long as you are taking the medicine. Pregnancy  If you are about to stop having your period (premenopausal) and  you may become pregnant, seek counseling before you get pregnant.  Take 400 to 800 micrograms (mcg) of folic acid every day if you become pregnant.  Ask for birth control (contraception) if you want to prevent pregnancy. Osteoporosis and menopause Osteoporosis is a disease in which the bones lose minerals and strength with aging. This can result in bone fractures. If you are 65 years old or older, or if you are at risk for osteoporosis and fractures, ask your health care provider if you should:  Be screened for bone loss.  Take a calcium or vitamin D supplement to lower your risk of fractures.  Be given hormone replacement therapy (HRT) to treat symptoms of menopause. Follow these instructions at home: Lifestyle  Do not use any products that contain nicotine or tobacco, such as cigarettes, e-cigarettes, and chewing tobacco. If you need help quitting, ask your health care provider.  Do not use street drugs.  Do not share needles.  Ask your health care provider for help if you need support or information about quitting drugs. Alcohol use  Do not drink alcohol if: ? Your health care provider tells you not to drink. ? You are pregnant, may be pregnant, or are planning to become pregnant.  If you drink alcohol: ? Limit how much you use to 0-1 drink a day. ? Limit intake if you are breastfeeding.  Be aware of how much alcohol is in your drink. In the [place] one drink equals one 12 oz bottle of beer (355 mL), one 5 oz glass of wine (148 mL), or one 1 oz glass of hard liquor (44 mL). General instructions  Schedule regular health, dental, and eye exams.  Stay current with your vaccines.  Tell your health care provider if: ? You often feel depressed. ? You have ever been abused or do not feel safe at home. Summary  Adopting a healthy lifestyle and getting preventive care are important in promoting health and wellness.  Follow your health care provider's instructions about healthy  diet, exercising, and getting tested or screened for diseases.  Follow your health care provider's instructions on monitoring your cholesterol and blood pressure. This information is not intended to replace advice given to you by your health care provider. Make sure you discuss any questions you have with your health care provider. Document Revised: 04/18/2018 Document Reviewed: 04/18/2018 Elsevier Patient Education  2021 [employer]vier [employer].  

## 2020-08-25 NOTE — Progress Notes (Signed)
45 y.o. [id number] Legally Separated female here for annual exam.  Here to establish care. Insurance changed and has to change providers. Went to GYN In [place] 04/2020 for problems with menstruation (periods were irregular). She had an [place] there and was told that she had a polyp (never spotting or heavy bleeding) She has been taking Provera 10 mg daily to help reduce polyp since then. Has already taken for 3 months, now would like to follow through with plan which was to follow up with a repeat ultrasound. (Records available in Care Everywhere)  Pelvic ultrasound report 04/23/2020:  Uterus 7.6 x 4.8 x 3.8 cm. Endometrial stripe 6.5 mm. Left ovary 2.1 x 1.5 x 2.0 cm. Right ovary 2.4 x 1.8 x 1.3 cm. Anteverted uterus is normal in size with a 15 mm intramural fibroid in the mid posterior wall. There is also a feeder vessel coursing into an echogenic soft tissue mass within the upper right cavity consistent with endometrial polyp. This measures 1.6 x 1.3 x 4 mm. Ovaries appear normal bilaterally. Left ovary has a 2.5 x 2.3 x 2.0 cm corpus luteum.   HPI: Periods started getting irregular in 2020's.   Has had about 3-4 periods in the last 12 months, they last only 3 days. FSH drawn 03/30/2020= 31.2, Estradiol = <15         02/21/2020= 12.4, Estradiol = 163  Is experiencing pain with intercourse intermittently (about 1/2 the time) for about 1 year, mainly externally. Feels dry. Was treated for yeast infection 2 weeks ago and not burning anymore but pain is still there.  Is establishing care with Dr. Jacquiline Doe next week to help manage diabetes. Last a1c 8.1. Was diagnosed with diabetes during her pregnancy.   Has 2 children 11 and 6 (one prior C-section) Works as a care taker for elderly  Is still sexually active with husband but they are legally separated.  Patient's last menstrual period was 05/25/2020 (exact date).            Sexually active: Yes.    The current method of family planning is  tubal ligation.    Exercising: No.  exercise Smoker:  no  Health Maintenance: Pap:  04-14-17 neg HPV HR neg, per patient she had pap done after that History of abnormal Pap:  no MMG:  02-03-2020 category b density birads 1:neg Colonoscopy:  none BMD:   none Gardasil:   n/a Covid-19: pfizer Hep C testing: not done   reports that she has never smoked. She has never used smokeless tobacco. She reports that she does not drink alcohol and does not use drugs.  Past Medical History:  Diagnosis Date  . Acid indigestion 09/07/2016  . Anemia 10/24/2016  . Diabetes (HCC)   . HTN (hypertension)     Past Surgical History:  Procedure Laterality Date  . BREAST BIOPSY Left 2014   BENIGN  . CESAREAN SECTION WITH BILATERAL TUBAL LIGATION  2015    Current Outpatient Medications  Medication Sig Dispense Refill  . atorvastatin (LIPITOR) 10 MG tablet Take 1 tablet (10 mg total) by mouth daily. 90 tablet 1  . B Complex Vitamins (VITAMIN B COMPLEX) TABS Take 1 tablet by mouth daily.    . fenofibrate 160 MG tablet Take 160 mg by mouth daily.    . irbesartan (AVAPRO) 75 MG tablet Take by mouth.    . medroxyPROGESTERone (PROVERA) 10 MG tablet Take by mouth.    Marland Kitchen MEGARED OMEGA-3 KRILL OIL 500 MG CAPS  Take 1 capsule by mouth daily. 30 capsule 5  . metFORMIN (GLUCOPHAGE-XR) 500 MG 24 hr tablet TAKE 2 TABLETS BY MOUTH EVERY DAY WITH BREAKFAST    . propranolol (INDERAL) 80 MG tablet TAKE 1 TABLET (80 MG TOTAL) BY MOUTH 3 (THREE) TIMES DAILY. 90 tablet 5  . TRULICITY 3 MG/0.5ML SOPN SMARTSIG:0.5 Milliliter(s) SUB-Q Once a Week     No current facility-administered medications for this visit.    Family History  Problem Relation Age of Onset  . Diabetes Mother   . Hypertension Mother   . Hypertension Father   . Diabetes Brother   . Stroke Brother     Review of Systems  Constitutional: Negative.   HENT: Negative.   Eyes: Negative.   Respiratory: Negative.   Cardiovascular: Negative.    Gastrointestinal: Negative.   Endocrine: Negative.   Genitourinary:       Amenorrhea  Musculoskeletal: Negative.   Skin: Negative.   Allergic/Immunologic: Negative.   Neurological: Negative.   Hematological: Negative.   Psychiatric/Behavioral: Negative.     Exam:   BP 140/88   Pulse 88   Resp 16   Ht 5' 0.5" (1.537 m)   Wt 136 lb (61.7 kg)   LMP 05/25/2020 (Exact Date)   BMI 26.12 kg/m   Height: 5' 0.5" (153.7 cm)  General appearance: alert, cooperative and appears stated age, no acute distress Head: Normocephalic, without obvious abnormality Neck: no adenopathy, thyroid normal to inspection and palpation Lungs: clear to auscultation bilaterally Breasts: No axillary or supraclavicular adenopathy, Normal to palpation without dominant masses Heart: regular rate and rhythm Abdomen: soft, non-tender; no masses,  no organomegaly Extremities: extremities normal, no edema Skin: No rashes or lesions Lymph nodes: Cervical, supraclavicular, and axillary nodes normal. No abnormal inguinal nodes palpated Neurologic: Grossly normal   Pelvic: External genitalia:  no lesions              Urethra:  normal appearing urethra with no masses, tenderness or lesions              Bartholins and Skenes: normal                 Vagina: normal appearing vagina, atrophic, normal appearing discharge, no lesions              Cervix: neg cervical motion tenderness, no visible lesions             Bimanual Exam:   Uterus:  normal size, contour, position, consistency, mobility, non-tender              Adnexa: no mass, fullness, tenderness                [place] mount-WNL  Joy, CMA Chaperone was present for exam.  A:  Well woman exam -   Hx: diabetes and HTN  Plan: Cytology - PAP( [place] [facility])  Dyspareunia, female - Plan: WET PREP FOR TRICH, YEAST, CLUE For now recommend UberLube, if no benefit, may try vaginal estrogen  Uterine polyp - Plan: US PELVIS TRANSVAGINAL NON-OB (TV ONLY)   P:   Pap  : done today  Mammogram: pt to schedule 01/2021  Labs:no new (to to establish care with PCP this week)  Medications: no new  Pelvic US for uterine polyp. Advised patient that she may discontinue Provera  Encouraged to keep bleeding diary

## 2020-08-27 LAB — CYTOLOGY - PAP
Comment: NEGATIVE
Diagnosis: NEGATIVE
Diagnosis: REACTIVE
High risk HPV: NEGATIVE

## 2020-08-27 NOTE — Progress Notes (Signed)
Please notify normal pap smear and HPV test. Her next pap smear will be due in 2027 (5 years)

## 2020-08-31 ENCOUNTER — Encounter: Payer: Self-pay | Admitting: Family Medicine

## 2020-08-31 ENCOUNTER — Ambulatory Visit (INDEPENDENT_AMBULATORY_CARE_PROVIDER_SITE_OTHER): Payer: 59 | Admitting: Family Medicine

## 2020-08-31 ENCOUNTER — Other Ambulatory Visit: Payer: Self-pay

## 2020-08-31 VITALS — BP 121/75 | HR 75 | Temp 98.7°F | Ht 60.24 in | Wt 165.6 lb

## 2020-08-31 DIAGNOSIS — Z79899 Other long term (current) drug therapy: Secondary | ICD-10-CM

## 2020-08-31 DIAGNOSIS — E1169 Type 2 diabetes mellitus with other specified complication: Secondary | ICD-10-CM | POA: Insufficient documentation

## 2020-08-31 DIAGNOSIS — E1159 Type 2 diabetes mellitus with other circulatory complications: Secondary | ICD-10-CM

## 2020-08-31 DIAGNOSIS — E785 Hyperlipidemia, unspecified: Secondary | ICD-10-CM

## 2020-08-31 DIAGNOSIS — E1165 Type 2 diabetes mellitus with hyperglycemia: Secondary | ICD-10-CM

## 2020-08-31 DIAGNOSIS — I152 Hypertension secondary to endocrine disorders: Secondary | ICD-10-CM | POA: Insufficient documentation

## 2020-08-31 MED ORDER — METFORMIN HCL ER 500 MG PO TB24: 1000.0000 mg | ORAL_TABLET | Freq: Two times a day (BID) | ORAL | 3 refills | Status: DC

## 2020-08-31 MED ORDER — TRULICITY 3 MG/0.5ML ~~LOC~~ SOAJ: SUBCUTANEOUS | 5 refills | Status: DC

## 2020-08-31 MED ORDER — ATORVASTATIN CALCIUM 10 MG PO TABS: 1.0000 | ORAL_TABLET | Freq: Every day | ORAL | 3 refills | Status: DC

## 2020-08-31 MED ORDER — IRBESARTAN 75 MG PO TABS: 75.0000 mg | ORAL_TABLET | Freq: Every day | ORAL | 3 refills | Status: DC

## 2020-08-31 MED ORDER — FENOFIBRATE 160 MG PO TABS: 160.0000 mg | ORAL_TABLET | Freq: Every day | ORAL | 3 refills | Status: DC

## 2020-08-31 NOTE — Patient Instructions (Signed)
It was very nice to see you today!  We will check blood work this week.  I will refill your medications.  Please verify the dose of your propranolol so that we can refill this for you.  I will see you back in 3 to 6 months.  Please come back to see me sooner if needed.  Take care, Dr Jimmey Ralph  PLEASE NOTE:  If you had any lab tests please let us know if you have not heard back within a few days. You may see your results on mychart before we have a chance to review them but we will give you a call once they are reviewed by [place]. If we ordered any referrals today, please let us know if you have not heard from their office within the next week.   Please try these tips to maintain a healthy lifestyle:   Eat at least 3 REAL meals and 1-2 snacks per day.  Aim for no more than 5 hours between eating.  If you eat breakfast, please do so within one hour of getting up.    Each meal should contain half fruits/vegetables, one quarter protein, and one quarter carbs (no bigger than a computer mouse)   Cut down on sweet beverages. This includes juice, soda, and sweet tea.     Drink at least 1 glass of water with each meal and aim for at least 8 glasses per day   Exercise at least 150 minutes every week.

## 2020-08-31 NOTE — Assessment & Plan Note (Signed)
Check A1c blood draw.  She is on metformin 1000 mg twice daily and Trulicity 3 mg weekly.  We will need to recheck in 3 to 6 months depending on result of A1c.

## 2020-08-31 NOTE — Assessment & Plan Note (Signed)
At goal today.  She is on irbesartan 75 mg daily.  She is also on propranolol however dosing is not clear.  She will check when she gets home.  May consider starting extended release formulation.

## 2020-08-31 NOTE — Assessment & Plan Note (Signed)
Check lipid panel.  Continue Lipitor 10 mg daily and fenofibrate 160 mg daily.

## 2020-08-31 NOTE — Progress Notes (Signed)
Katherine Barr is a 45 y.o. female who presents today for an office visit.  She is a new patient.   Assessment/Plan:  Chronic Problems Addressed Today: Type 2 diabetes mellitus with hyperglycemia (HCC) Check A1c blood draw.  She is on metformin 1000 mg twice daily and Trulicity 3 mg weekly.  We will need to recheck in 3 to 6 months depending on result of A1c.  Dyslipidemia associated with type 2 diabetes mellitus (HCC) Check lipid panel.  Continue Lipitor 10 mg daily and fenofibrate 160 mg daily.  Hypertension associated with diabetes (HCC) At goal today.  She is on irbesartan 75 mg daily.  She is also on propranolol however dosing is not clear.  She will check when she gets home.  May consider starting extended release formulation.  Preventative Healthcare Plan records from previous PCP.  Patient up-to-date on screenings.    Subjective:  HPI:  See a/p.  PMH:  The following were reviewed and entered/updated in epic: Past Medical History:  Diagnosis Date  . Diabetes mellitus without complication (HCC)   . Hyperlipidemia   . Hypertension    Patient Active Problem List   Diagnosis Date Noted  . Hypertension associated with diabetes (HCC) 08/31/2020  . Dyslipidemia associated with type 2 diabetes mellitus (HCC) 08/31/2020  . Type 2 diabetes mellitus with hyperglycemia (HCC) 08/31/2020   History reviewed. No pertinent surgical history.  Family History  Problem Relation Age of Onset  . Diabetes Mother   . Hypertension Father   . Diabetes Brother     Medications- reviewed and updated Current Outpatient Medications  Medication Sig Dispense Refill  . propranolol (INDERAL) 10 MG tablet Take 20 mg by mouth 3 (three) times daily.    Marland Kitchen atorvastatin (LIPITOR) 10 MG tablet Take 1 tablet (10 mg total) by mouth daily. 90 tablet 3  . fenofibrate 160 MG tablet Take 1 tablet (160 mg total) by mouth daily. 90 tablet 3  . irbesartan (AVAPRO) 75 MG tablet Take 1 tablet (75 mg total)  by mouth at bedtime. 90 tablet 3  . metFORMIN (GLUCOPHAGE-XR) 500 MG 24 hr tablet Take 2 tablets (1,000 mg total) by mouth 2 (two) times daily. 360 tablet 3  . TRULICITY 3 MG/0.5ML SOPN Inject 3mg  once weekly. 2 mL 5   No current facility-administered medications for this visit.    Allergies-reviewed and updated No Known Allergies  Social History   Socioeconomic History  . Marital status: Married    Spouse name: Not on file  . Number of children: Not on file  . Years of education: Not on file  . Highest education level: Not on file  Occupational History  . Not on file  Tobacco Use  . Smoking status: Never Smoker  . Smokeless tobacco: Never Used  Substance and Sexual Activity  . Alcohol use: Never  . Drug use: Never  . Sexual activity: Not on file  Other Topics Concern  . Not on file  Social History Narrative  . Not on file   Social Determinants of Health   Financial Resource Strain: Not on file  Food Insecurity: Not on file  Transportation Needs: Not on file  Physical Activity: Not on file  Stress: Not on file  Social Connections: Not on file           Objective:  Physical Exam: BP 121/75   Pulse 75   Temp 98.7 F (37.1 C) (Temporal)   Ht 5' 0.24" (1.53 m)   Wt 165 lb  9.6 oz (75.1 kg)   LMP 08/27/2020   SpO2 100%   BMI 32.09 kg/m   Gen: No acute distress, resting comfortably CV: Regular rate and rhythm with no murmurs appreciated Pulm: Normal work of breathing, clear to auscultation bilaterally with no crackles, wheezes, or rhonchi Neuro: Grossly normal, moves all extremities Psych: Normal affect and thought content      Mikiah Demond M. Jimmey Ralph, MD 08/31/2020 1:56 PM

## 2020-09-01 ENCOUNTER — Encounter: Payer: Self-pay | Admitting: Nurse Practitioner

## 2020-09-03 ENCOUNTER — Other Ambulatory Visit: Payer: Self-pay

## 2020-09-03 ENCOUNTER — Other Ambulatory Visit (INDEPENDENT_AMBULATORY_CARE_PROVIDER_SITE_OTHER): Payer: 59

## 2020-09-03 DIAGNOSIS — Z79899 Other long term (current) drug therapy: Secondary | ICD-10-CM | POA: Diagnosis not present

## 2020-09-03 DIAGNOSIS — E1165 Type 2 diabetes mellitus with hyperglycemia: Secondary | ICD-10-CM | POA: Diagnosis not present

## 2020-09-03 DIAGNOSIS — E785 Hyperlipidemia, unspecified: Secondary | ICD-10-CM

## 2020-09-03 DIAGNOSIS — E1169 Type 2 diabetes mellitus with other specified complication: Secondary | ICD-10-CM | POA: Diagnosis not present

## 2020-09-03 LAB — CBC
HCT: 35.2 % — ABNORMAL LOW (ref 36.0–46.0)
Hemoglobin: 11.7 g/dL — ABNORMAL LOW (ref 12.0–15.0)
MCHC: 33.3 g/dL (ref 30.0–36.0)
MCV: 82.8 fl (ref 78.0–100.0)
Platelets: 360 10*3/uL (ref 150.0–400.0)
RBC: 4.26 Mil/uL (ref 3.87–5.11)
RDW: 13.5 % (ref 11.5–15.5)
WBC: 7.9 10*3/uL (ref 4.0–10.5)

## 2020-09-03 LAB — COMPREHENSIVE METABOLIC PANEL
ALT: 15 U/L (ref 0–35)
AST: 14 U/L (ref 0–37)
Albumin: 4.2 g/dL (ref 3.5–5.2)
Alkaline Phosphatase: 34 U/L — ABNORMAL LOW (ref 39–117)
BUN: 9 mg/dL (ref 6–23)
CO2: 25 mEq/L (ref 19–32)
Calcium: 9.8 mg/dL (ref 8.4–10.5)
Chloride: 104 mEq/L (ref 96–112)
Creatinine, Ser: 0.69 mg/dL (ref 0.40–1.20)
GFR: 105.79 mL/min (ref >60.00)
Glucose, Bld: 137 mg/dL — ABNORMAL HIGH (ref 70–99)
Potassium: 4.4 mEq/L (ref 3.5–5.1)
Sodium: 139 mEq/L (ref 135–145)
Total Bilirubin: 0.6 mg/dL (ref 0.2–1.2)
Total Protein: 6.7 g/dL (ref 6.0–8.3)

## 2020-09-03 LAB — LIPID PANEL
Cholesterol: 93 mg/dL (ref 0–200)
HDL: 26.6 mg/dL — ABNORMAL LOW (ref >39.00)
LDL Cholesterol: 52 mg/dL (ref 0–99)
NonHDL: 66
Total CHOL/HDL Ratio: 3
Triglycerides: 68 mg/dL (ref 0.0–149.0)
VLDL: 13.6 mg/dL (ref 0.0–40.0)

## 2020-09-03 LAB — VITAMIN B12: Vitamin B-12: 752 pg/mL (ref 211–911)

## 2020-09-03 LAB — HEMOGLOBIN A1C: Hgb A1c MFr Bld: 7.3 % — ABNORMAL HIGH (ref 4.6–6.5)

## 2020-09-03 LAB — TSH: TSH: 2.55 u[IU]/mL (ref 0.35–4.50)

## 2020-09-04 NOTE — Progress Notes (Signed)
Please inform patient of the following:  Her labs are all stable.  A1c up just a small amount compared to last time at 7.3 but is in acceptable range.  I would like for her to continue her current medications.  We can recheck her A1c in 3 to 6 months.  She should continue working on diet and exercise.  We can recheck everything else in year.

## 2020-09-16 ENCOUNTER — Other Ambulatory Visit: Payer: 59 | Admitting: Obstetrics and Gynecology

## 2020-09-16 ENCOUNTER — Other Ambulatory Visit: Payer: 59

## 2020-10-15 ENCOUNTER — Other Ambulatory Visit: Payer: 59

## 2020-10-15 ENCOUNTER — Other Ambulatory Visit: Payer: 59 | Admitting: Obstetrics and Gynecology

## 2020-11-05 ENCOUNTER — Ambulatory Visit: Payer: 59 | Admitting: Obstetrics and Gynecology

## 2020-11-05 ENCOUNTER — Encounter: Payer: Self-pay | Admitting: Obstetrics and Gynecology

## 2020-11-05 ENCOUNTER — Other Ambulatory Visit: Payer: Self-pay

## 2020-11-05 ENCOUNTER — Ambulatory Visit (INDEPENDENT_AMBULATORY_CARE_PROVIDER_SITE_OTHER): Payer: 59

## 2020-11-05 VITALS — BP 122/64 | HR 93 | Ht 61.0 in | Wt 165.0 lb

## 2020-11-05 DIAGNOSIS — N951 Menopausal and female climacteric states: Secondary | ICD-10-CM | POA: Diagnosis not present

## 2020-11-05 DIAGNOSIS — N840 Polyp of corpus uteri: Secondary | ICD-10-CM

## 2020-11-05 DIAGNOSIS — N939 Abnormal uterine and vaginal bleeding, unspecified: Secondary | ICD-10-CM

## 2020-11-05 NOTE — Progress Notes (Signed)
GYNECOLOGY  VISIT   HPI: 45 y.o.   Legally Separated   Asian White or Caucasian Not Hispanic or Latino  female   [phone] with No LMP recorded.   here for ultrasound consult for an h/o an endometrial polyp and AUB.   Chart reviewed the patient was seen at [facility] at the end of last year with irregular bleeding. She had no cycle from July, 2021 until the end of November, 2021.  02/21/20 labs: Estradiol 163 FSH 12.4  03/30/20 labs: Estradiol <15 FSH 31.2  Ultrasound report from [facility] was reviewed. It was concerning for a 1.6 cm endometrial polyp. She was offered hysteroscopy, D&C vs 3 months of provera and follow up ultrasound. She chose the provera and f/u ultrasound. She stopped the provera in 4/22 and states that she has had normal cycles since then.  GYNECOLOGIC HISTORY: No LMP recorded. Contraception:Tubal ligation  Menopausal hormone therapy: none         OB History     Gravida  6   Para  2   Term  2   Preterm  0   AB  4   Living  2      SAB  4   IAB  0   Ectopic  0   Multiple      Live Births  2              Patient Active Problem List   Diagnosis Date Noted   Hypertension associated with diabetes (HCC) 08/31/2020   Dyslipidemia associated with type 2 diabetes mellitus (HCC) 08/31/2020   Type 2 diabetes mellitus with hyperglycemia (HCC) 08/31/2020   Insomnia 10/24/2016   Anemia 10/24/2016   Diabetes (HCC) 09/07/2016   HTN (hypertension) 09/07/2016   Acid indigestion 09/07/2016    Past Medical History:  Diagnosis Date   Acid indigestion 09/07/2016   Anemia 10/24/2016   Diabetes (HCC)    Diabetes mellitus without complication (HCC)    HTN (hypertension)    Hyperlipidemia    Hypertension     Past Surgical History:  Procedure Laterality Date   BREAST BIOPSY Left 2014   BENIGN   CESAREAN SECTION WITH BILATERAL TUBAL LIGATION  2015    Current Outpatient Medications  Medication Sig Dispense Refill   atorvastatin (LIPITOR) 10 MG tablet  Take 1 tablet (10 mg total) by mouth daily. 90 tablet 1   B Complex Vitamins (VITAMIN B COMPLEX) TABS Take 1 tablet by mouth daily.     fenofibrate 160 MG tablet Take 160 mg by mouth daily.     fenofibrate 160 MG tablet Take 1 tablet (160 mg total) by mouth daily. 90 tablet 3   irbesartan (AVAPRO) 75 MG tablet Take by mouth.     medroxyPROGESTERone (PROVERA) 10 MG tablet Take by mouth.     MEGARED OMEGA-3 KRILL OIL 500 MG CAPS Take 1 capsule by mouth daily. 30 capsule 5   metFORMIN (GLUCOPHAGE-XR) 500 MG 24 hr tablet TAKE 2 TABLETS BY MOUTH EVERY DAY WITH BREAKFAST     metFORMIN (GLUCOPHAGE-XR) 500 MG 24 hr tablet Take 2 tablets (1,000 mg total) by mouth 2 (two) times daily. 360 tablet 3   propranolol (INDERAL) 10 MG tablet Take 20 mg by mouth 3 (three) times daily.     TRULICITY 3 MG/0.5ML SOPN SMARTSIG:0.5 Milliliter(s) SUB-Q Once a Week     No current facility-administered medications for this visit.     ALLERGIES: Amlodipine and Lisinopril  Family History  Problem Relation Age of Onset  Diabetes Mother    Hypertension Father    Diabetes Brother    Hypertension Mother    Diabetes Brother    Stroke Brother     Social History   Socioeconomic History   Marital status: Legally Separated    Spouse name: Not on file   Number of children: Not on file   Years of education: Not on file   Highest education level: Not on file  Occupational History   Not on file  Tobacco Use   Smoking status: Never   Smokeless tobacco: Never  Substance and Sexual Activity   Alcohol use: Never   Drug use: Never   Sexual activity: Yes    Partners: Male    Birth control/protection: Surgical    Comment: btl  Other Topics Concern   Not on file  Social History Narrative   ** Merged History Encounter **       Social Determinants of Health   Financial Resource Strain: Not on file  Food Insecurity: Not on file  Transportation Needs: Not on file  Physical Activity: Not on file  Stress: Not on  file  Social Connections: Not on file  Intimate Partner Violence: Not on file    Review of Systems  All other systems reviewed and are negative.  PHYSICAL EXAMINATION:    BP 122/64   Pulse 93   Ht 5\' 1"  (1.549 m)   Wt 165 lb (74.8 kg)   SpO2 100%   BMI 31.18 kg/m     General appearance: alert, cooperative and appears stated age  Pelvic ultrasound  Indications: H/O endometrial polyp, AUB  Findings:  Uterus 7.37 x 4.52 x 4.62 cm  1.13 x 1.30 cm posterior, intramural myoma.   Endometrium 7.75 mm  Endometrial mass 1.23 x 1 cm on 3D imaging.   Left ovary 2.75 x 1.35 x 1.19 cm  Right ovary 1.99 x 1.56 x 1.44 cm  No free fluid   Impression:  Normal sized anteverted uterus Small intramural myoma Endometrial mass up to 1.23 cm, consistent with endometrial polyp Normal ovaries bilaterally  1. Abnormal uterine bleeding (AUB) The patient went 4 months without bleeding, then had a few days of spotting. Lab work from [facility] is c/w perimenopause. Ultrasound at [facility] was concerning for an endometrial polyp  2. Endometrial polyp Still noted on ultrasound today Discussed recommendation for removal of polyps in premenopausal women that have been associated with AUB, including oligomenorrhea.  Discussed normal menstrual cycles and anovulation. Discussed long term risk on anovulation Plan: hysteroscopy, polypectomy, dilation and curettage with mirena IUD insertion. Reviewed risks, including: bleeding, infection, and uterine perforation We discussed that the mirena IUD will give her endometrial protection and help prevent development of further polyps  3. Perimenopausal Discussed anovulatory cycles that can go along with perimenopause (see above).  In addition to discussing ultrasound results, records from [facility] were reviewed (notes, labs and ultrasound). The patient was counseled on recommendation for surgery, discussed surgery and the option of the mirena IUD. Also reviewed side  effects of the mirena IUD and information was given.

## 2020-11-13 ENCOUNTER — Other Ambulatory Visit: Payer: Self-pay | Admitting: Family Medicine

## 2020-11-13 DIAGNOSIS — Z1231 Encounter for screening mammogram for malignant neoplasm of breast: Secondary | ICD-10-CM

## 2020-11-25 ENCOUNTER — Telehealth: Payer: Self-pay | Admitting: *Deleted

## 2020-11-25 NOTE — Telephone Encounter (Signed)
Surgery: hysteroscopy, d&c, possible mirena IUD inserion  Diagnosis: Abnormal uterine bleeding, endometrial polyp  Location: [facility]  Status: Outpatient  Time: 30 Minutes  Assistant: N/A  Urgency: At Patient's Convenience  Pre-Op Appointment: To Be Scheduled  Post-Op Appointment(s): 1 Week,   Time Out Of Work: Day Of Surgery, 1 Day Post Op  Please ask her if she wants the mirena IUD for endometrial protection and to try and prevent further polyp formation.

## 2020-11-25 NOTE — Telephone Encounter (Signed)
-----   Message from Jerilynn Mages sent at 11/23/2020 11:57 AM EDT ----- Regarding: surgery Patient has decided to have surgery. She know you will be calling her to set this up.

## 2020-11-25 NOTE — Telephone Encounter (Signed)
OV on 11/05/20 w/ Dr. Oscar La. Surgery discussed.   Routing to Dr. Oscar La to advise on surgery.

## 2020-12-01 ENCOUNTER — Other Ambulatory Visit: Payer: Self-pay

## 2020-12-01 ENCOUNTER — Encounter: Payer: Self-pay | Admitting: Family Medicine

## 2020-12-01 ENCOUNTER — Ambulatory Visit (INDEPENDENT_AMBULATORY_CARE_PROVIDER_SITE_OTHER): Payer: 59 | Admitting: Family Medicine

## 2020-12-01 VITALS — BP 124/81 | HR 79 | Temp 97.8°F | Ht 61.0 in | Wt 136.0 lb

## 2020-12-01 DIAGNOSIS — I152 Hypertension secondary to endocrine disorders: Secondary | ICD-10-CM

## 2020-12-01 DIAGNOSIS — D649 Anemia, unspecified: Secondary | ICD-10-CM

## 2020-12-01 DIAGNOSIS — E1165 Type 2 diabetes mellitus with hyperglycemia: Secondary | ICD-10-CM

## 2020-12-01 DIAGNOSIS — E1159 Type 2 diabetes mellitus with other circulatory complications: Secondary | ICD-10-CM | POA: Diagnosis not present

## 2020-12-01 DIAGNOSIS — E119 Type 2 diabetes mellitus without complications: Secondary | ICD-10-CM | POA: Diagnosis not present

## 2020-12-01 LAB — POCT GLYCOSYLATED HEMOGLOBIN (HGB A1C): Hemoglobin A1C: 7 % — AB (ref 4.0–5.6)

## 2020-12-01 MED ORDER — RYBELSUS 7 MG PO TABS: 7.0000 mg | ORAL_TABLET | Freq: Every day | ORAL | 5 refills | Status: DC

## 2020-12-01 NOTE — Patient Instructions (Signed)
It was very nice to see you today!  Your A1c looks good.  We will switch from Trulicity to Rybelsus.  Please follow-up with me in 6 months.  Let me know if you have any issues with medication change beforehand.  Take care, Dr Jimmey Ralph  PLEASE NOTE:  If you had any lab tests please let us know if you have not heard back within a few days. You may see your results on mychart before we have a chance to review them but we will give you a call once they are reviewed by [place]. If we ordered any referrals today, please let us know if you have not heard from their office within the next week.   Please try these tips to maintain a healthy lifestyle:  Eat at least 3 REAL meals and 1-2 snacks per day.  Aim for no more than 5 hours between eating.  If you eat breakfast, please do so within one hour of getting up.   Each meal should contain half fruits/vegetables, one quarter protein, and one quarter carbs (no bigger than a computer mouse)  Cut down on sweet beverages. This includes juice, soda, and sweet tea.   Drink at least 1 glass of water with each meal and aim for at least 8 glasses per day  Exercise at least 150 minutes every week.

## 2020-12-01 NOTE — Assessment & Plan Note (Signed)
Stable.  We can recheck CBC with next blood draw.

## 2020-12-01 NOTE — Assessment & Plan Note (Signed)
A1c stable 7.0.  She is having some injection site pain with Trulicity.  We will switch to Rybelsus 7 mg daily.  Continue metformin 1000 mg twice daily.  Continue lifestyle modifications.  Recheck A1c 6 months.

## 2020-12-01 NOTE — Assessment & Plan Note (Signed)
At goal today on irbesartan 75 mg daily and propranolol 20 mg 3 times daily.

## 2020-12-01 NOTE — Telephone Encounter (Signed)
Left message to call Bradley Handyside, RN at [facility], [phone].  

## 2020-12-01 NOTE — Progress Notes (Signed)
   Katherine Barr is a 45 y.o. female who presents today for an office visit.  Assessment/Plan:  Chronic Problems Addressed Today: Type 2 diabetes mellitus with hyperglycemia (HCC) A1c stable 7.0.  She is having some injection site pain with Trulicity.  We will switch to Rybelsus 7 mg daily.  Continue metformin 1000 mg twice daily.  Continue lifestyle modifications.  Recheck A1c 6 months.  Hypertension associated with diabetes (HCC) At goal today on irbesartan 75 mg daily and propranolol 20 mg 3 times daily.  Anemia Stable.  We can recheck CBC with next blood draw.     Subjective:  HPI:  See A/p.         Objective:  Physical Exam: BP 124/81   Pulse 79   Temp 97.8 F (36.6 C) (Temporal)   Ht 5\' 1"  (1.549 m)   Wt 136 lb (61.7 kg)   SpO2 99%   BMI 25.70 kg/m   Wt Readings from Last 3 Encounters:  12/01/20 136 lb (61.7 kg)  11/05/20 165 lb (74.8 kg)  08/31/20 165 lb 9.6 oz (75.1 kg)  Gen: No acute distress, resting comfortably Neuro: Grossly normal, moves all extremities Psych: Normal affect and thought content      Katherine Barr M. 09/02/20, MD 12/01/2020 3:37 PM

## 2020-12-17 NOTE — Telephone Encounter (Signed)
Spoke with patient. Reviewed surgery dates. Patient request to proceed with surgery on 01/18/21.  Advised patient I will forward to business office for return call. I will return call once surgery date and time confirmed. Patient verbalizes understanding and is agreeable.   Surgery request sent.   Routing to [employer]

## 2020-12-25 NOTE — Telephone Encounter (Signed)
Spoke with patient. Surgery date request confirmed.  Advised surgery is scheduled for 912/22, [facility] at 1130. Surgery instruction sheet and hospital brochure reviewed, printed copy will be mailed.  Patient advised if Covid screening and quarantine requirements and agreeable.  Pre-op on 01/07/21.   Routing to [employer] for benefits. May close encounter once completed.

## 2020-12-29 NOTE — Telephone Encounter (Signed)
Spoke with patient regarding surgery benefits. Patient acknowledges understanding of information presented. Patient is aware that benefits presented are professional benefits only. Patient is aware that once surgery is scheduled, the hospital will call with separate benefits. See account note.  Encounter closed. 

## 2021-01-05 NOTE — H&P (View-Only) (Signed)
GYNECOLOGY  VISIT   HPI: 45 y.o.   Legally Separated Asian Not Hispanic or Latino  female   [phone] with No LMP recorded.   here for surgical consult. The patient has a h/o abnormal uterine bleeding, endometrial polyp noted on imaging. Patient is perimenopausal, h/o anovulatory cycles. Has tolerable vasomotor symptoms.   LMP 11/05/20.  GYNECOLOGIC HISTORY: No LMP recorded. Contraception:tubal ligation Menopausal hormone therapy: NA        OB History     Gravida  6   Para  2   Term  2   Preterm  0   AB  4   Living  2      SAB  4   IAB  0   Ectopic  0   Multiple      Live Births  2              Patient Active Problem List   Diagnosis Date Noted   Hypertension associated with diabetes (HCC) 08/31/2020   Dyslipidemia associated with type 2 diabetes mellitus (HCC) 08/31/2020   Type 2 diabetes mellitus with hyperglycemia (HCC) 08/31/2020   Amenorrhea 03/30/2020   Insomnia 10/24/2016   Anemia 10/24/2016   HTN (hypertension) 09/07/2016   Acid indigestion 09/07/2016    Past Medical History:  Diagnosis Date   Acid indigestion 09/07/2016   Anemia 10/24/2016   Diabetes (HCC)    Diabetes mellitus without complication (HCC)    HTN (hypertension)    Hyperlipidemia    Hypertension     Past Surgical History:  Procedure Laterality Date   BREAST BIOPSY Left 2014   BENIGN   CESAREAN SECTION WITH BILATERAL TUBAL LIGATION  2015    Current Outpatient Medications  Medication Sig Dispense Refill   atorvastatin (LIPITOR) 10 MG tablet Take 1 tablet (10 mg total) by mouth daily. 90 tablet 1   B Complex Vitamins (VITAMIN B COMPLEX) TABS Take 1 tablet by mouth daily.     fenofibrate 160 MG tablet Take 1 tablet (160 mg total) by mouth daily. 90 tablet 3   irbesartan (AVAPRO) 75 MG tablet Take by mouth.     medroxyPROGESTERone (PROVERA) 10 MG tablet Take by mouth.     MEGARED OMEGA-3 KRILL OIL 500 MG CAPS Take 1 capsule by mouth daily. 30 capsule 5   metFORMIN  (GLUCOPHAGE-XR) 500 MG 24 hr tablet Take 2 tablets (1,000 mg total) by mouth 2 (two) times daily. 360 tablet 3   propranolol (INDERAL) 10 MG tablet Take 20 mg by mouth 3 (three) times daily.     Semaglutide (RYBELSUS) 7 MG TABS Take 7 mg by mouth daily. 30 tablet 5   No current facility-administered medications for this visit.     ALLERGIES: Amlodipine and Lisinopril  Family History  Problem Relation Age of Onset   Diabetes Mother    Hypertension Father    Diabetes Brother    Hypertension Mother    Diabetes Brother    Stroke Brother     Social History   Socioeconomic History   Marital status: Legally Separated    Spouse name: Not on file   Number of children: Not on file   Years of education: Not on file   Highest education level: Not on file  Occupational History   Not on file  Tobacco Use   Smoking status: Never   Smokeless tobacco: Never  Substance and Sexual Activity   Alcohol use: Never   Drug use: Never   Sexual activity: Yes  Partners: Male    Birth control/protection: Surgical    Comment: btl  Other Topics Concern   Not on file  Social History Narrative   ** Merged History Encounter **       Social Determinants of Health   Financial Resource Strain: Not on file  Food Insecurity: Not on file  Transportation Needs: Not on file  Physical Activity: Not on file  Stress: Not on file  Social Connections: Not on file  Intimate Partner Violence: Not on file    ROS  PHYSICAL EXAMINATION:    There were no vitals taken for this visit.    General appearance: alert, cooperative and appears stated age Neck: no adenopathy, supple, symmetrical, trachea midline and thyroid normal to inspection and palpation Heart: regular rate and rhythm Lungs: CTAB Abdomen: soft, non-tender; bowel sounds normal; no masses,  no organomegaly Extremities: normal, atraumatic, no cyanosis Skin: normal color, texture and turgor, no rashes or lesions Lymph: normal cervical  supraclavicular and inguinal nodes Neurologic: grossly normal    1. Abnormal uterine bleeding (AUB) U/S c/w endometrial polyp  2. Endometrial polyp Plan: hysteroscopy, polypectomy, dilation and curettage. Reviewed risks, including: bleeding, infection, uterine perforation, fluid overload, need for further sugery  3. Perimenopausal H/O anovulatory cycles We discussed the option of cyclic provera or the mirena IUD for cycle control. She would prefer the mirnea (this portion of the discussion was done with an interpreter)

## 2021-01-05 NOTE — Progress Notes (Signed)
GYNECOLOGY  VISIT   HPI: 45 y.o.   Legally Separated Asian Not Hispanic or Latino  female   [id number] with No LMP recorded.   here for surgical consult. The patient has a h/o abnormal uterine bleeding, endometrial polyp noted on imaging. Patient is perimenopausal, h/o anovulatory cycles. Has tolerable vasomotor symptoms.   LMP 11/05/20.  GYNECOLOGIC HISTORY: No LMP recorded. Contraception:tubal ligation Menopausal hormone therapy: NA        OB History     Gravida  6   Para  2   Term  2   Preterm  0   AB  4   Living  2      SAB  4   IAB  0   Ectopic  0   Multiple      Live Births  2              Patient Active Problem List   Diagnosis Date Noted   Hypertension associated with diabetes (HCC) 08/31/2020   Dyslipidemia associated with type 2 diabetes mellitus (HCC) 08/31/2020   Type 2 diabetes mellitus with hyperglycemia (HCC) 08/31/2020   Amenorrhea 03/30/2020   Insomnia 10/24/2016   Anemia 10/24/2016   HTN (hypertension) 09/07/2016   Acid indigestion 09/07/2016    Past Medical History:  Diagnosis Date   Acid indigestion 09/07/2016   Anemia 10/24/2016   Diabetes (HCC)    Diabetes mellitus without complication (HCC)    HTN (hypertension)    Hyperlipidemia    Hypertension     Past Surgical History:  Procedure Laterality Date   BREAST BIOPSY Left 2014   BENIGN   CESAREAN SECTION WITH BILATERAL TUBAL LIGATION  2015    Current Outpatient Medications  Medication Sig Dispense Refill   atorvastatin (LIPITOR) 10 MG tablet Take 1 tablet (10 mg total) by mouth daily. 90 tablet 1   B Complex Vitamins (VITAMIN B COMPLEX) TABS Take 1 tablet by mouth daily.     fenofibrate 160 MG tablet Take 1 tablet (160 mg total) by mouth daily. 90 tablet 3   irbesartan (AVAPRO) 75 MG tablet Take by mouth.     medroxyPROGESTERone (PROVERA) 10 MG tablet Take by mouth.     MEGARED OMEGA-3 KRILL OIL 500 MG CAPS Take 1 capsule by mouth daily. 30 capsule 5   metFORMIN  (GLUCOPHAGE-XR) 500 MG 24 hr tablet Take 2 tablets (1,000 mg total) by mouth 2 (two) times daily. 360 tablet 3   propranolol (INDERAL) 10 MG tablet Take 20 mg by mouth 3 (three) times daily.     Semaglutide (RYBELSUS) 7 MG TABS Take 7 mg by mouth daily. 30 tablet 5   No current facility-administered medications for this visit.     ALLERGIES: Amlodipine and Lisinopril  Family History  Problem Relation Age of Onset   Diabetes Mother    Hypertension Father    Diabetes Brother    Hypertension Mother    Diabetes Brother    Stroke Brother     Social History   Socioeconomic History   Marital status: Legally Separated    Spouse name: Not on file   Number of children: Not on file   Years of education: Not on file   Highest education level: Not on file  Occupational History   Not on file  Tobacco Use   Smoking status: Never   Smokeless tobacco: Never  Substance and Sexual Activity   Alcohol use: Never   Drug use: Never   Sexual activity: Yes      Partners: Male    Birth control/protection: Surgical    Comment: btl  Other Topics Concern   Not on file  Social History Narrative   ** Merged History Encounter **       Social Determinants of Health   Financial Resource Strain: Not on file  Food Insecurity: Not on file  Transportation Needs: Not on file  Physical Activity: Not on file  Stress: Not on file  Social Connections: Not on file  Intimate Partner Violence: Not on file    ROS  PHYSICAL EXAMINATION:    There were no vitals taken for this visit.    General appearance: alert, cooperative and appears stated age Neck: no adenopathy, supple, symmetrical, trachea midline and thyroid normal to inspection and palpation Heart: regular rate and rhythm Lungs: CTAB Abdomen: soft, non-tender; bowel sounds normal; no masses,  no organomegaly Extremities: normal, atraumatic, no cyanosis Skin: normal color, texture and turgor, no rashes or lesions Lymph: normal cervical  supraclavicular and inguinal nodes Neurologic: grossly normal    1. Abnormal uterine bleeding (AUB) U/S c/w endometrial polyp  2. Endometrial polyp Plan: hysteroscopy, polypectomy, dilation and curettage. Reviewed risks, including: bleeding, infection, uterine perforation, fluid overload, need for further sugery  3. Perimenopausal H/O anovulatory cycles We discussed the option of cyclic provera or the mirena IUD for cycle control. She would prefer the mirnea (this portion of the discussion was done with an interpreter)

## 2021-01-07 ENCOUNTER — Encounter: Payer: Self-pay | Admitting: Obstetrics and Gynecology

## 2021-01-07 ENCOUNTER — Ambulatory Visit (INDEPENDENT_AMBULATORY_CARE_PROVIDER_SITE_OTHER): Payer: 59 | Admitting: Obstetrics and Gynecology

## 2021-01-07 ENCOUNTER — Other Ambulatory Visit: Payer: Self-pay

## 2021-01-07 VITALS — BP 120/76 | HR 74 | Resp 16 | Wt 138.0 lb

## 2021-01-07 DIAGNOSIS — N939 Abnormal uterine and vaginal bleeding, unspecified: Secondary | ICD-10-CM

## 2021-01-07 DIAGNOSIS — N840 Polyp of corpus uteri: Secondary | ICD-10-CM

## 2021-01-07 DIAGNOSIS — N951 Menopausal and female climacteric states: Secondary | ICD-10-CM

## 2021-01-14 ENCOUNTER — Encounter (HOSPITAL_BASED_OUTPATIENT_CLINIC_OR_DEPARTMENT_OTHER): Payer: Self-pay | Admitting: Obstetrics and Gynecology

## 2021-01-14 ENCOUNTER — Other Ambulatory Visit: Payer: Self-pay

## 2021-01-14 NOTE — Progress Notes (Signed)
Spoke w/ via phone for pre-op interview---pt Lab needs dos----  I STAT, EKG, Urine POCT            Lab results------none COVID test -----patient states asymptomatic no test needed Arrive at -------0930 01/18/2021 NPO after MN NO Solid Food.  Clear liquids from MN until---0830 Med rec completed Medications to take morning of surgery -----Propranolol Diabetic medication -----none dos Patient instructed no nail polish to be worn day of surgery Patient instructed to bring photo id and insurance card day of surgery Patient aware to have Driver (ride ) / caregiver husband Nelish  for 24 hours after surgery  Patient Special Instructions -----none Pre-Op special Istructions -----none Patient verbalized understanding of instructions that were given at this phone interview. Patient denies shortness of breath, chest pain, fever, cough at this phone interview.

## 2021-01-18 ENCOUNTER — Encounter (HOSPITAL_BASED_OUTPATIENT_CLINIC_OR_DEPARTMENT_OTHER)
Admission: RE | Disposition: A | Discharge status: Home / Self Care | Payer: Self-pay | Source: Home / Self Care | Attending: Obstetrics and Gynecology

## 2021-01-18 ENCOUNTER — Ambulatory Visit (HOSPITAL_BASED_OUTPATIENT_CLINIC_OR_DEPARTMENT_OTHER): Payer: 59 | Admitting: Anesthesiology

## 2021-01-18 ENCOUNTER — Ambulatory Visit (HOSPITAL_BASED_OUTPATIENT_CLINIC_OR_DEPARTMENT_OTHER)
Admission: RE | Admit: 2021-01-18 | Discharge: 2021-01-18 | Disposition: A | Discharge status: Home / Self Care | Payer: 59 | Attending: Obstetrics and Gynecology | Admitting: Obstetrics and Gynecology

## 2021-01-18 ENCOUNTER — Encounter (HOSPITAL_BASED_OUTPATIENT_CLINIC_OR_DEPARTMENT_OTHER): Payer: Self-pay | Admitting: Obstetrics and Gynecology

## 2021-01-18 DIAGNOSIS — Z793 Long term (current) use of hormonal contraceptives: Secondary | ICD-10-CM | POA: Diagnosis not present

## 2021-01-18 DIAGNOSIS — N939 Abnormal uterine and vaginal bleeding, unspecified: Secondary | ICD-10-CM | POA: Insufficient documentation

## 2021-01-18 DIAGNOSIS — Z79899 Other long term (current) drug therapy: Secondary | ICD-10-CM | POA: Diagnosis not present

## 2021-01-18 DIAGNOSIS — N840 Polyp of corpus uteri: Secondary | ICD-10-CM

## 2021-01-18 DIAGNOSIS — N951 Menopausal and female climacteric states: Secondary | ICD-10-CM | POA: Insufficient documentation

## 2021-01-18 DIAGNOSIS — Z888 Allergy status to other drugs, medicaments and biological substances status: Secondary | ICD-10-CM | POA: Diagnosis not present

## 2021-01-18 DIAGNOSIS — N970 Female infertility associated with anovulation: Secondary | ICD-10-CM | POA: Insufficient documentation

## 2021-01-18 DIAGNOSIS — Z7984 Long term (current) use of oral hypoglycemic drugs: Secondary | ICD-10-CM | POA: Insufficient documentation

## 2021-01-18 HISTORY — DX: Headache, unspecified: R51.9

## 2021-01-18 HISTORY — PX: INTRAUTERINE DEVICE (IUD) INSERTION: SHX5877

## 2021-01-18 HISTORY — PX: HYSTEROSCOPY WITH D & C: SHX1775

## 2021-01-18 HISTORY — DX: Presence of spectacles and contact lenses: Z97.3

## 2021-01-18 HISTORY — DX: Personal history of urinary calculi: Z87.442

## 2021-01-18 HISTORY — DX: Gastro-esophageal reflux disease without esophagitis: K21.9

## 2021-01-18 LAB — POCT I-STAT, CHEM 8
BUN: 9 mg/dL (ref 6–20)
Calcium, Ion: 1.16 mmol/L (ref 1.15–1.40)
Chloride: 102 mmol/L (ref 98–111)
Creatinine, Ser: 0.5 mg/dL (ref 0.44–1.00)
Glucose, Bld: 159 mg/dL — ABNORMAL HIGH (ref 70–99)
HCT: 38 % (ref 36.0–46.0)
Hemoglobin: 12.9 g/dL (ref 12.0–15.0)
Potassium: 4.3 mmol/L (ref 3.5–5.1)
Sodium: 136 mmol/L (ref 135–145)
TCO2: 23 mmol/L (ref 22–32)

## 2021-01-18 LAB — GLUCOSE, CAPILLARY: Glucose-Capillary: 111 mg/dL — ABNORMAL HIGH (ref 70–99)

## 2021-01-18 LAB — POCT PREGNANCY, URINE: Preg Test, Ur: NEGATIVE

## 2021-01-18 SURGERY — DILATATION AND CURETTAGE /HYSTEROSCOPY
Anesthesia: General | Site: Uterus

## 2021-01-18 MED ORDER — ONDANSETRON HCL 4 MG/2ML IJ SOLN
INTRAMUSCULAR | Status: DC | PRN
  Administered 2021-01-18: 4 mg via INTRAVENOUS

## 2021-01-18 MED ORDER — SODIUM CHLORIDE 0.9 % IR SOLN
Status: DC | PRN
  Administered 2021-01-18: 3000 mL

## 2021-01-18 MED ORDER — MIDAZOLAM HCL 5 MG/5ML IJ SOLN
INTRAMUSCULAR | Status: DC | PRN
  Administered 2021-01-18: 1 mg via INTRAVENOUS

## 2021-01-18 MED ORDER — ACETAMINOPHEN 500 MG PO TABS
ORAL_TABLET | ORAL | Status: AC
  Filled 2021-01-18: qty 1

## 2021-01-18 MED ORDER — PROPOFOL 10 MG/ML IV BOLUS
INTRAVENOUS | Status: AC
  Filled 2021-01-18: qty 20

## 2021-01-18 MED ORDER — AMISULPRIDE (ANTIEMETIC) 5 MG/2ML IV SOLN: 10.0000 mg | Freq: Once | INTRAVENOUS | Status: DC | PRN

## 2021-01-18 MED ORDER — ACETAMINOPHEN 325 MG PO TABS: 325.0000 mg | ORAL_TABLET | ORAL | Status: DC | PRN

## 2021-01-18 MED ORDER — OXYCODONE HCL 5 MG PO TABS: 5.0000 mg | ORAL_TABLET | Freq: Once | ORAL | Status: DC | PRN

## 2021-01-18 MED ORDER — PROPOFOL 10 MG/ML IV BOLUS
INTRAVENOUS | Status: DC | PRN
  Administered 2021-01-18: 150 mg via INTRAVENOUS

## 2021-01-18 MED ORDER — LEVONORGESTREL 20 MCG/DAY IU IUD
INTRAUTERINE_SYSTEM | INTRAUTERINE | Status: AC
  Filled 2021-01-18: qty 1

## 2021-01-18 MED ORDER — DEXAMETHASONE SODIUM PHOSPHATE 10 MG/ML IJ SOLN
INTRAMUSCULAR | Status: AC
  Filled 2021-01-18: qty 1

## 2021-01-18 MED ORDER — FENTANYL CITRATE (PF) 100 MCG/2ML IJ SOLN
INTRAMUSCULAR | Status: DC | PRN
  Administered 2021-01-18: 50 ug via INTRAVENOUS
  Administered 2021-01-18 (×2): 25 ug via INTRAVENOUS

## 2021-01-18 MED ORDER — FENTANYL CITRATE (PF) 100 MCG/2ML IJ SOLN: 25.0000 ug | INTRAMUSCULAR | Status: DC | PRN

## 2021-01-18 MED ORDER — ONDANSETRON HCL 4 MG/2ML IJ SOLN
INTRAMUSCULAR | Status: AC
  Filled 2021-01-18: qty 2

## 2021-01-18 MED ORDER — ACETAMINOPHEN 500 MG PO TABS
ORAL_TABLET | ORAL | Status: AC
  Filled 2021-01-18: qty 2

## 2021-01-18 MED ORDER — LIDOCAINE HCL (CARDIAC) PF 100 MG/5ML IV SOSY
PREFILLED_SYRINGE | INTRAVENOUS | Status: DC | PRN
  Administered 2021-01-18: 100 mg via INTRAVENOUS

## 2021-01-18 MED ORDER — GLYCOPYRROLATE 0.2 MG/ML IJ SOLN
INTRAMUSCULAR | Status: DC | PRN
  Administered 2021-01-18: .1 mg via INTRAVENOUS

## 2021-01-18 MED ORDER — LEVONORGESTREL 20 MCG/DAY IU IUD
1.0000 | INTRAUTERINE_SYSTEM | INTRAUTERINE | Status: AC
  Administered 2021-01-18: 1 via INTRAUTERINE

## 2021-01-18 MED ORDER — LACTATED RINGERS IV SOLN: INTRAVENOUS | Status: DC

## 2021-01-18 MED ORDER — KETOROLAC TROMETHAMINE 30 MG/ML IJ SOLN
INTRAMUSCULAR | Status: DC | PRN
  Administered 2021-01-18: 30 mg via INTRAVENOUS

## 2021-01-18 MED ORDER — PROMETHAZINE HCL 25 MG/ML IJ SOLN: 6.2500 mg | INTRAMUSCULAR | Status: DC | PRN

## 2021-01-18 MED ORDER — LIDOCAINE HCL (PF) 2 % IJ SOLN
INTRAMUSCULAR | Status: AC
  Filled 2021-01-18: qty 5

## 2021-01-18 MED ORDER — KETOROLAC TROMETHAMINE 30 MG/ML IJ SOLN
INTRAMUSCULAR | Status: AC
  Filled 2021-01-18: qty 1

## 2021-01-18 MED ORDER — FENTANYL CITRATE (PF) 100 MCG/2ML IJ SOLN
INTRAMUSCULAR | Status: AC
  Filled 2021-01-18: qty 2

## 2021-01-18 MED ORDER — MIDAZOLAM HCL 2 MG/2ML IJ SOLN
INTRAMUSCULAR | Status: AC
  Filled 2021-01-18: qty 2

## 2021-01-18 MED ORDER — ACETAMINOPHEN 500 MG PO TABS
1000.0000 mg | ORAL_TABLET | ORAL | Status: AC
  Administered 2021-01-18: 1000 mg via ORAL

## 2021-01-18 MED ORDER — DEXAMETHASONE SODIUM PHOSPHATE 4 MG/ML IJ SOLN
INTRAMUSCULAR | Status: DC | PRN
  Administered 2021-01-18: 5 mg via INTRAVENOUS

## 2021-01-18 MED ORDER — OXYCODONE HCL 5 MG/5ML PO SOLN: 5.0000 mg | Freq: Once | ORAL | Status: DC | PRN

## 2021-01-18 MED ORDER — SCOPOLAMINE 1 MG/3DAYS TD PT72: 1.0000 | MEDICATED_PATCH | TRANSDERMAL | Status: DC

## 2021-01-18 MED ORDER — ACETAMINOPHEN 160 MG/5ML PO SOLN: 325.0000 mg | ORAL | Status: DC | PRN

## 2021-01-18 MED ORDER — ACETAMINOPHEN 10 MG/ML IV SOLN: 1000.0000 mg | Freq: Once | INTRAVENOUS | Status: DC | PRN

## 2021-01-18 SURGICAL SUPPLY — 21 items
CATH ROBINSON RED A/P 16FR (CATHETERS) IMPLANT
DEVICE MYOSURE LITE (MISCELLANEOUS) ×2 IMPLANT
DEVICE MYOSURE REACH (MISCELLANEOUS) IMPLANT
DILATOR CANAL MILEX (MISCELLANEOUS) IMPLANT
GAUZE 4X4 16PLY ~~LOC~~+RFID DBL (SPONGE) ×4 IMPLANT
GLOVE SURG ENC MOIS LTX SZ6.5 (GLOVE) ×2 IMPLANT
GOWN STRL REUS W/TWL LRG LVL3 (GOWN DISPOSABLE) ×2 IMPLANT
IV NS IRRIG 3000ML ARTHROMATIC (IV SOLUTION) ×2 IMPLANT
KIT PROCEDURE FLUENT (KITS) ×2 IMPLANT
KIT TURNOVER CYSTO (KITS) ×2 IMPLANT
MIRENA ×2 IMPLANT
MYOSURE XL FIBROID (MISCELLANEOUS)
PACK VAGINAL MINOR WOMEN LF (CUSTOM PROCEDURE TRAY) ×2 IMPLANT
PAD OB MATERNITY 4.3X12.25 (PERSONAL CARE ITEMS) ×2 IMPLANT
PAD PREP 24X48 CUFFED NSTRL (MISCELLANEOUS) ×2 IMPLANT
SEAL CERVICAL OMNI LOK (ABLATOR) IMPLANT
SEAL ROD LENS SCOPE MYOSURE (ABLATOR) ×2 IMPLANT
SYR 20ML LL LF (SYRINGE) IMPLANT
SYSTEM TISS REMOVAL MYOSURE XL (MISCELLANEOUS) IMPLANT
TOWEL OR 17X26 10 PK STRL BLUE (TOWEL DISPOSABLE) ×4 IMPLANT
WATER STERILE IRR 500ML POUR (IV SOLUTION) IMPLANT

## 2021-01-18 NOTE — Transfer of Care (Signed)
Immediate Anesthesia Transfer of Care Note  Patient: Glenis Smoker  Procedure(s) Performed: DILATATION AND CURETTAGE /HYSTEROSCOPY WITH MYOSURE (Uterus) Mirena INTRAUTERINE DEVICE (IUD) INSERTION (Uterus)  Patient Location: PACU  Anesthesia Type:General  Level of Consciousness: awake, alert  and oriented  Airway & Oxygen Therapy: Patient Spontanous Breathing and Patient connected to nasal cannula oxygen  Post-op Assessment: Report given to RN and Post -op Vital signs reviewed and stable  Post vital signs: Reviewed and stable  Last Vitals:  Vitals Value Taken Time  BP 134/78 01/18/21 1150  Temp 36.3 C 01/18/21 1150  Pulse 64 01/18/21 1158  Resp 17 01/18/21 1158  SpO2 100 % 01/18/21 1158  Vitals shown include unvalidated device data.  Last Pain:  Vitals:   01/18/21 1150  TempSrc:   PainSc: 0-No pain      Patients Stated Pain Goal: 5 (01/18/21 0925)  Complications: No notable events documented.

## 2021-01-18 NOTE — Interval H&P Note (Signed)
History and Physical Interval Note:  01/18/2021 10:50 AM  Katherine Barr  has presented today for surgery, with the diagnosis of Enometrial polyp, abnormal uterine bleeding.  The various methods of treatment have been discussed with the patient and family. After consideration of risks, benefits and other options for treatment, the patient has consented to  Procedure(s): DILATATION AND CURETTAGE /HYSTEROSCOPY (N/A) Mirena INTRAUTERINE DEVICE (IUD) INSERTION (N/A) as a surgical intervention.  The patient's history has been reviewed, patient examined, no change in status, stable for surgery.  I have reviewed the patient's chart and labs.  Questions were answered to the patient's satisfaction.     Romualdo Bolk

## 2021-01-18 NOTE — Op Note (Addendum)
Preoperative Diagnosis: abnormal uterine bleeding, endometrial polyp  Postoperative Diagnosis: Same  Procedure: Hysteroscopy, polypectomy, dilation and curettage, mirena intrauterine device placement  Surgeon: Dr Gertie Exon  Assistants: None  Anesthesia: General via LMA  EBL: 5 cc  Fluids: 750 cc  Fluid deficit: 75 cc  Urine output: none  Indications for surgery: The patient is a 45 yo female, who presented with abnormal uterine bleeding. Ultrasound was concerning for endometrial polyp. TSH is normal.  The risks of the surgery were reviewed with the patient and the consent form was signed prior to her surgery.  Findings: EUA: normal sized anteverted uterus, no adnexal masses. Hysteroscopy: endometrial polyp, otherwise normal cavity  Specimens: endometrial polyp, endometrial curettigs   Procedure: The patient was taken to the operating room with an IV in place. She was placed in the dorsal lithotomy position and anesthesia was administered. She was prepped and draped in the usual sterile fashion for a vaginal procedure. She was in and out catheterized, no urine drained. A speculum was placed in the vagina and a single tooth tenaculum was placed on the anterior lip of the cervix. The cervix was dilated to a #6.5 hagar dilator. The uterus was sounded to 8 cm. The myosure hysteroscope was inserted into the uterine cavity. With continuous infusion of normal saline, the uterine cavity was visualized with the above findings. The myosure light was used to remove the polyp. The myosure was then removed. The cavity was then curetted with the small sharp curette. The cavity had the characteristically gritty texture at the end of the procedure. The curette and the single tooth tenaculum were removed. Oozing from the tenaculum site was stopped with pressure. The speculum was removed. The patients perineum was cleansed of betadine and she was taken out of the dorsal lithotomy position.  Upon  awakening the LMA was removed and the patient was transferred to the recovery room in stable and awake condition.  The sponge and instrument count were correct. There were no complications.   Addendum: After removal of the curette, the mirena intrauterine device was placed into the uterine cavity without difficulty. The strings were cut between 2-3 cm. After placement of the IUD the tenaculum was removed.

## 2021-01-18 NOTE — Anesthesia Postprocedure Evaluation (Signed)
Anesthesia Post Note  Patient: Katherine Barr  Procedure(s) Performed: DILATATION AND CURETTAGE /HYSTEROSCOPY WITH MYOSURE (Uterus) Mirena INTRAUTERINE DEVICE (IUD) INSERTION (Uterus)     Patient location during evaluation: PACU Anesthesia Type: General Level of consciousness: awake and alert Pain management: pain level controlled Vital Signs Assessment: post-procedure vital signs reviewed and stable Respiratory status: spontaneous breathing, nonlabored ventilation, respiratory function stable and patient connected to nasal cannula oxygen Cardiovascular status: blood pressure returned to baseline and stable Postop Assessment: no apparent nausea or vomiting Anesthetic complications: no   No notable events documented.  Last Vitals:  Vitals:   01/18/21 1230 01/18/21 1245  BP: (!) 142/78 (!) 150/99  Pulse: 61 80  Resp: 16 16  Temp:  (!) 36.3 C  SpO2: 99% 100%    Last Pain:  Vitals:   01/18/21 1245  TempSrc:   PainSc: 2                  Shelton Silvas

## 2021-01-18 NOTE — Anesthesia Procedure Notes (Signed)
Procedure Name: LMA Insertion Date/Time: 01/18/2021 11:18 AM Performed by: Cleda Clarks, CRNA Pre-anesthesia Checklist: Patient identified, Emergency Drugs available, Suction available and Patient being monitored Patient Re-evaluated:Patient Re-evaluated prior to induction Oxygen Delivery Method: Circle system utilized Preoxygenation: Pre-oxygenation with 100% oxygen Induction Type: IV induction Ventilation: Mask ventilation without difficulty LMA: LMA inserted LMA Size: 3.0 Number of attempts: 1 Placement Confirmation: positive ETCO2 Tube secured with: Tape Dental Injury: Teeth and Oropharynx as per pre-operative assessment

## 2021-01-18 NOTE — Anesthesia Postprocedure Evaluation (Deleted)
Anesthesia Post Note  Patient: Glenis Smoker  Procedure(s) Performed: DILATATION AND CURETTAGE /HYSTEROSCOPY WITH MYOSURE (Uterus) Mirena INTRAUTERINE DEVICE (IUD) INSERTION (Uterus)     Patient location during evaluation: PACU Anesthesia Type: General Level of consciousness: awake and alert Pain management: pain level controlled Vital Signs Assessment: post-procedure vital signs reviewed and stable Respiratory status: spontaneous breathing, nonlabored ventilation, respiratory function stable and patient connected to nasal cannula oxygen Cardiovascular status: blood pressure returned to baseline and stable Postop Assessment: no apparent nausea or vomiting Anesthetic complications: no   No notable events documented.  Last Vitals:  Vitals:   01/18/21 1150 01/18/21 1200  BP: 134/78 118/73  Pulse: 64 63  Resp: 15 17  Temp: (!) 36.3 C   SpO2: 100% 100%    Last Pain:  Vitals:   01/18/21 1150  TempSrc:   PainSc: 0-No pain                 Shelton Silvas

## 2021-01-18 NOTE — Discharge Instructions (Signed)
  Post Anesthesia Home Care Instructions  Activity: Get plenty of rest for the remainder of the day. A responsible individual must stay with you for 24 hours following the procedure.  For the next 24 hours, DO NOT: -Drive a car -Operate machinery -Drink alcoholic beverages -Take any medication unless instructed by your physician -Make any legal decisions or sign important papers.  Meals: Start with liquid foods such as gelatin or soup. Progress to regular foods as tolerated. Avoid greasy, spicy, heavy foods. If nausea and/or vomiting occur, drink only clear liquids until the nausea and/or vomiting subsides. Call your physician if vomiting continues.  Special Instructions/Symptoms: Your throat may feel dry or sore from the anesthesia or the breathing tube placed in your throat during surgery. If this causes discomfort, gargle with warm salt water. The discomfort should disappear within 24 hours.  If you had a scopolamine patch placed behind your ear for the management of post- operative nausea and/or vomiting:  1. The medication in the patch is effective for 72 hours, after which it should be removed.  Wrap patch in a tissue and discard in the trash. Wash hands thoroughly with soap and water. 2. You may remove the patch earlier than 72 hours if you experience unpleasant side effects which may include dry mouth, dizziness or visual disturbances. 3. Avoid touching the patch. Wash your hands with soap and water after contact with the patch.      [facility] Home care Instructions:   Personal hygiene:  Used sanitary napkins for vaginal drainage not tampons. Shower or tub bathe the day after your procedure. No douching until bleeding stops. Always wipe from front to back after  Elimination.  Activity: Do not drive or operate any equipment today. The effects of the anesthesia are still present and drowsiness may result. Rest today, not necessarily flat bed rest, just take it easy. You may resume your  normal activity in one to 2 days.  Sexual activity: No intercourse for one week or as indicated by your physician  Diet: Eat a light diet as desired this evening. You may resume a regular diet tomorrow.  Return to work: One to 2 days.  General Expectations of your surgery: Vaginal bleeding should be no heavier than a normal period. Spotting may continue up to 10 days. Mild cramps may continue for a couple of days. You may have a regular period in 2-6 weeks.  Unexpected observations call your doctor if these occur: persistent or heavy bleeding. Severe abdominal cramping or pain. Elevation of temperature greater than 100F.  Call for an appointment in one week.   

## 2021-01-18 NOTE — Anesthesia Preprocedure Evaluation (Addendum)
Anesthesia Evaluation  Patient identified by MRN, date of birth, ID band Patient awake    Reviewed: Allergy & Precautions, NPO status , Patient's Chart, lab work & pertinent test results  Airway Mallampati: I  TM Distance: >3 FB Neck ROM: Full    Dental  (+) Teeth Intact, Dental Advisory Given   Pulmonary neg pulmonary ROS,    breath sounds clear to auscultation       Cardiovascular hypertension,  Rhythm:Regular Rate:Normal     Neuro/Psych  Headaches, negative psych ROS   GI/Hepatic Neg liver ROS, GERD  ,  Endo/Other  diabetes, Type 2, Oral Hypoglycemic Agents  Renal/GU negative Renal ROS     Musculoskeletal negative musculoskeletal ROS (+)   Abdominal Normal abdominal exam  (+)   Peds  Hematology   Anesthesia Other Findings   Reproductive/Obstetrics                            Anesthesia Physical Anesthesia Plan  ASA: 2  Anesthesia Plan: General   Post-op Pain Management:    Induction: Intravenous  PONV Risk Score and Plan: 4 or greater and Ondansetron, Dexamethasone, Midazolam and Scopolamine patch - Pre-op  Airway Management Planned: LMA  Additional Equipment: None  Intra-op Plan:   Post-operative Plan: Extubation in OR  Informed Consent: I have reviewed the patients History and Physical, chart, labs and discussed the procedure including the risks, benefits and alternatives for the proposed anesthesia with the patient or authorized representative who has indicated his/her understanding and acceptance.     Dental advisory given  Plan Discussed with: CRNA  Anesthesia Plan Comments:        Anesthesia Quick Evaluation

## 2021-01-19 ENCOUNTER — Encounter (HOSPITAL_BASED_OUTPATIENT_CLINIC_OR_DEPARTMENT_OTHER): Payer: Self-pay | Admitting: Obstetrics and Gynecology

## 2021-01-19 LAB — SURGICAL PATHOLOGY

## 2021-01-27 ENCOUNTER — Encounter: Payer: Self-pay | Admitting: Obstetrics and Gynecology

## 2021-01-27 ENCOUNTER — Ambulatory Visit (INDEPENDENT_AMBULATORY_CARE_PROVIDER_SITE_OTHER): Payer: 59 | Admitting: Obstetrics and Gynecology

## 2021-01-27 ENCOUNTER — Other Ambulatory Visit: Payer: Self-pay

## 2021-01-27 VITALS — BP 122/80 | HR 60 | Ht 61.0 in | Wt 139.0 lb

## 2021-01-27 DIAGNOSIS — N3941 Urge incontinence: Secondary | ICD-10-CM

## 2021-01-27 DIAGNOSIS — Z9889 Other specified postprocedural states: Secondary | ICD-10-CM

## 2021-01-27 DIAGNOSIS — Z30431 Encounter for routine checking of intrauterine contraceptive device: Secondary | ICD-10-CM

## 2021-01-27 NOTE — Progress Notes (Signed)
GYNECOLOGY  VISIT   HPI: 45 y.o.   Legally Separated Asian Not Hispanic or Latino  female   [phone] with No LMP recorded. (Menstrual status: IUD).   here for a 1 week post op s/p hysteroscopy, polypectomy, D&C and mirena IUD insertion. Pathology was benign. No bleeding, no pain.   Since the surgery she reports urge incontinence. She had this problem years ago, but not for a long time. No frequency, urgency or dysuria. She feels she is emptying her bladder.   GYNECOLOGIC HISTORY: No LMP recorded. (Menstrual status: IUD). Contraception:IUD  Menopausal hormone therapy: none         OB History     Gravida  6   Para  2   Term  2   Preterm  0   AB  4   Living  2      SAB  4   IAB  0   Ectopic  0   Multiple      Live Births  2              Patient Active Problem List   Diagnosis Date Noted   Hypertension associated with diabetes (HCC) 08/31/2020   Dyslipidemia associated with type 2 diabetes mellitus (HCC) 08/31/2020   Type 2 diabetes mellitus with hyperglycemia (HCC) 08/31/2020   Amenorrhea 03/30/2020   Insomnia 10/24/2016   Anemia 10/24/2016   HTN (hypertension) 09/07/2016   Acid indigestion 09/07/2016    Past Medical History:  Diagnosis Date   Acid indigestion 09/07/2016   Anemia 10/24/2016   DM Type 2    GERD (gastroesophageal reflux disease)    Headache    History of kidney stones    HTN (hypertension)    Hyperlipidemia    Hypertension    Wears glasses     Past Surgical History:  Procedure Laterality Date   BREAST BIOPSY Left 2014   BENIGN   CESAREAN SECTION WITH BILATERAL TUBAL LIGATION  2015   HYSTEROSCOPY WITH D & C N/A 01/18/2021   Procedure: DILATATION AND CURETTAGE /HYSTEROSCOPY WITH MYOSURE;  Surgeon: Romualdo Bolk, MD;  Location: [facility];  Service: Gynecology;  Laterality: N/A;   INTRAUTERINE DEVICE (IUD) INSERTION N/A 01/18/2021   Procedure: Mirena INTRAUTERINE DEVICE (IUD) INSERTION;  Surgeon: Romualdo Bolk, MD;  Location: [facility];  Service: Gynecology;  Laterality: N/A;    Current Outpatient Medications  Medication Sig Dispense Refill   atorvastatin (LIPITOR) 10 MG tablet Take 1 tablet (10 mg total) by mouth daily. (Patient taking differently: Take 10 mg by mouth every evening.) 90 tablet 1   B Complex Vitamins (VITAMIN B COMPLEX) TABS Take 1 tablet by mouth daily.     fenofibrate 160 MG tablet Take 1 tablet (160 mg total) by mouth daily. (Patient taking differently: Take 160 mg by mouth every evening.) 90 tablet 3   MEGARED OMEGA-3 KRILL OIL 500 MG CAPS Take 1 capsule by mouth daily. 30 capsule 5   metFORMIN (GLUCOPHAGE-XR) 500 MG 24 hr tablet Take 2 tablets (1,000 mg total) by mouth 2 (two) times daily. 360 tablet 3   propranolol (INDERAL) 10 MG tablet Take 20 mg by mouth 3 (three) times daily.     Semaglutide (RYBELSUS) 7 MG TABS Take 7 mg by mouth daily. 30 tablet 5   UNKNOWN TO PATIENT IMDUR unknown mg daily in pm per patient     No current facility-administered medications for this visit.     ALLERGIES: Amlodipine and Lisinopril  Family History  Problem Relation Age of Onset   Diabetes Mother    Hypertension Father    Diabetes Brother    Hypertension Mother    Diabetes Brother    Stroke Brother     Social History   Socioeconomic History   Marital status: Legally Separated    Spouse name: Not on file   Number of children: Not on file   Years of education: Not on file   Highest education level: Not on file  Occupational History   Not on file  Tobacco Use   Smoking status: Never   Smokeless tobacco: Never  Vaping Use   Vaping Use: Never used  Substance and Sexual Activity   Alcohol use: Never   Drug use: Never   Sexual activity: Yes    Partners: Male    Birth control/protection: Surgical    Comment: btl  Other Topics Concern   Not on file  Social History Narrative   ** Merged History Encounter **       Social Determinants of Health    Financial Resource Strain: Not on file  Food Insecurity: Not on file  Transportation Needs: Not on file  Physical Activity: Not on file  Stress: Not on file  Social Connections: Not on file  Intimate Partner Violence: Not on file    Review of Systems  All other systems reviewed and are negative.  PHYSICAL EXAMINATION:    BP 122/80   Pulse 60   Ht 5\' 1"  (1.549 m)   Wt 139 lb (63 kg)   SpO2 100%   BMI 26.26 kg/m     General appearance: alert, cooperative and appears stated age  Pelvic: External genitalia:  no lesions              Urethra:  normal appearing urethra with no masses, tenderness or lesions              Bartholins and Skenes: normal                 Vagina: normal appearing vagina with normal color and discharge, no lesions              Cervix: no lesions and IUD string 3-4 cm              Bimanual Exam:  Uterus:  normal size, contour, position, consistency, mobility, non-tender and anteverted              Adnexa: no mass, fullness, tenderness               Chaperone was present for exam.  1. History of hysteroscopy Benign pathology  2. IUD check up Doing well  3. Urge incontinence New onset, feels she is voiding normally.  - Urinalysis,Complete w/RFL Culture

## 2021-01-30 LAB — URINE CULTURE
MICRO NUMBER:: 12404005
SPECIMEN QUALITY:: ADEQUATE

## 2021-01-30 LAB — URINALYSIS, COMPLETE W/RFL CULTURE
Bilirubin Urine: NEGATIVE
Glucose, UA: NEGATIVE
Hgb urine dipstick: NEGATIVE
Hyaline Cast: NONE SEEN /LPF
Ketones, ur: NEGATIVE
Nitrites, Initial: NEGATIVE
Protein, ur: NEGATIVE
RBC / HPF: NONE SEEN /HPF (ref 0–2)
Specific Gravity, Urine: 1.01 (ref 1.001–1.035)
pH: 6.5 (ref 5.0–8.0)

## 2021-01-30 LAB — CULTURE INDICATED

## 2021-02-01 ENCOUNTER — Other Ambulatory Visit: Payer: Self-pay

## 2021-02-01 MED ORDER — SULFAMETHOXAZOLE-TRIMETHOPRIM 800-160 MG PO TABS: 1.0000 | ORAL_TABLET | Freq: Two times a day (BID) | ORAL | 0 refills | Status: DC

## 2021-02-03 ENCOUNTER — Ambulatory Visit: Payer: 59

## 2021-02-24 ENCOUNTER — Other Ambulatory Visit: Payer: Self-pay

## 2021-02-24 ENCOUNTER — Encounter: Payer: Self-pay | Admitting: Obstetrics and Gynecology

## 2021-02-24 ENCOUNTER — Ambulatory Visit (INDEPENDENT_AMBULATORY_CARE_PROVIDER_SITE_OTHER): Payer: 59 | Admitting: Obstetrics and Gynecology

## 2021-02-24 VITALS — BP 160/90 | HR 64 | Temp 98.3°F

## 2021-02-24 DIAGNOSIS — N921 Excessive and frequent menstruation with irregular cycle: Secondary | ICD-10-CM | POA: Diagnosis not present

## 2021-02-24 DIAGNOSIS — Z30431 Encounter for routine checking of intrauterine contraceptive device: Secondary | ICD-10-CM | POA: Diagnosis not present

## 2021-02-24 LAB — CBC
HCT: 37.1 % (ref 35.0–45.0)
Hemoglobin: 12.3 g/dL (ref 11.7–15.5)
MCH: 27.6 pg (ref 27.0–33.0)
MCHC: 33.2 g/dL (ref 32.0–36.0)
MCV: 83.4 fL (ref 80.0–100.0)
MPV: 11.2 fL (ref 7.5–12.5)
Platelets: 320 10*3/uL (ref 140–400)
RBC: 4.45 10*6/uL (ref 3.80–5.10)
RDW: 11.7 % (ref 11.0–15.0)
WBC: 8.7 10*3/uL (ref 3.8–10.8)

## 2021-02-24 MED ORDER — NORETHINDRONE ACETATE 5 MG PO TABS: 5.0000 mg | ORAL_TABLET | Freq: Every day | ORAL | 1 refills | Status: DC

## 2021-02-24 NOTE — Progress Notes (Signed)
GYNECOLOGY  VISIT   HPI: 45 y.o.   Legally Separated  Asian  female   [phone] with No LMP recorded. (Menstrual status: IUD).   here for bleeding daily since hysteroscopy. Patient states the last 4 days has been slightly heavy.  Bleeding began as spotting after surgery. She feels like she is having a menstrual period for the last 4 days.  Changing pad twice a day.  She is feeling more angry.  Gained weight over the last month.  Feels physically tired from all of the bleeding.   Minor pain.  No fevers.   Patient has hysteroscopy, Myosure polypectomy dilation and curettage, and Mirena IUD insertion on 01/18/21.   Indication for surgery was abnormal uterine bleeding and suspicion for a polyp.  Surgery was uncomplicated.   Final pathology report showed benign endometrial polyp and benign proliferative endometrium.  No hyperplasia or malignancy noted.   Prior to surgery, patient was having anovulatory cycles.  Her LMP prior to surgery was June, 2022.   She has had a tubal ligation.   Her bleeding is affecting her religion.  She is unable to go to the temple due to bleeding.   GYNECOLOGIC HISTORY: No LMP recorded. (Menstrual status: IUD). Contraception:  Mirena IUD 01-18-21 Menopausal hormone therapy:  none Last mammogram:  02-03-20 Neg/BiRads1--appt.03/05/21 Last pap smear: 08-25-20 Neg:Neg HR HPV, 04-14-17 Neg:Neg HR HPV        OB History     Gravida  6   Para  2   Term  2   Preterm  0   AB  4   Living  2      SAB  4   IAB  0   Ectopic  0   Multiple      Live Births  2              Patient Active Problem List   Diagnosis Date Noted   Hypertension associated with diabetes (HCC) 08/31/2020   Dyslipidemia associated with type 2 diabetes mellitus (HCC) 08/31/2020   Type 2 diabetes mellitus with hyperglycemia (HCC) 08/31/2020   Amenorrhea 03/30/2020   Insomnia 10/24/2016   Anemia 10/24/2016   HTN (hypertension) 09/07/2016   Acid indigestion 09/07/2016     Past Medical History:  Diagnosis Date   Acid indigestion 09/07/2016   Anemia 10/24/2016   DM Type 2    GERD (gastroesophageal reflux disease)    Headache    History of kidney stones    HTN (hypertension)    Hyperlipidemia    Hypertension    Wears glasses     Past Surgical History:  Procedure Laterality Date   BREAST BIOPSY Left 2014   BENIGN   CESAREAN SECTION WITH BILATERAL TUBAL LIGATION  2015   HYSTEROSCOPY WITH D & C N/A 01/18/2021   Procedure: DILATATION AND CURETTAGE /HYSTEROSCOPY WITH MYOSURE;  Surgeon: Romualdo Bolk, MD;  Location: [facility];  Service: Gynecology;  Laterality: N/A;   INTRAUTERINE DEVICE (IUD) INSERTION N/A 01/18/2021   Procedure: Mirena INTRAUTERINE DEVICE (IUD) INSERTION;  Surgeon: Romualdo Bolk, MD;  Location: [facility];  Service: Gynecology;  Laterality: N/A;    Current Outpatient Medications  Medication Sig Dispense Refill   atorvastatin (LIPITOR) 10 MG tablet Take 1 tablet (10 mg total) by mouth daily. (Patient taking differently: Take 10 mg by mouth every evening.) 90 tablet 1   B Complex Vitamins (VITAMIN B COMPLEX) TABS Take 1 tablet by mouth daily.     fenofibrate  160 MG tablet Take 1 tablet (160 mg total) by mouth daily. (Patient taking differently: Take 160 mg by mouth every evening.) 90 tablet 3   irbesartan (AVAPRO) 75 MG tablet Take 75 mg by mouth at bedtime.     MEGARED OMEGA-3 KRILL OIL 500 MG CAPS Take 1 capsule by mouth daily. 30 capsule 5   metFORMIN (GLUCOPHAGE-XR) 500 MG 24 hr tablet Take 2 tablets (1,000 mg total) by mouth 2 (two) times daily. 360 tablet 3   propranolol (INDERAL) 10 MG tablet Take 20 mg by mouth 3 (three) times daily.     Semaglutide (RYBELSUS) 7 MG TABS Take 7 mg by mouth daily. 30 tablet 5   UNKNOWN TO PATIENT IMDUR unknown mg daily in pm per patient     No current facility-administered medications for this visit.     ALLERGIES: Amlodipine and  Lisinopril  Family History  Problem Relation Age of Onset   Diabetes Mother    Hypertension Father    Diabetes Brother    Hypertension Mother    Diabetes Brother    Stroke Brother     Social History   Socioeconomic History   Marital status: Legally Separated    Spouse name: Not on file   Number of children: Not on file   Years of education: Not on file   Highest education level: Not on file  Occupational History   Not on file  Tobacco Use   Smoking status: Never   Smokeless tobacco: Never  Vaping Use   Vaping Use: Never used  Substance and Sexual Activity   Alcohol use: Never   Drug use: Never   Sexual activity: Yes    Partners: Male    Birth control/protection: Surgical    Comment: btl  Other Topics Concern   Not on file  Social History Narrative   ** Merged History Encounter **       Social Determinants of Health   Financial Resource Strain: Not on file  Food Insecurity: Not on file  Transportation Needs: Not on file  Physical Activity: Not on file  Stress: Not on file  Social Connections: Not on file  Intimate Partner Violence: Not on file    Review of Systems  Genitourinary:  Positive for vaginal bleeding.  All other systems reviewed and are negative.  PHYSICAL EXAMINATION:    BP (!) 160/90   Pulse 64   Temp 98.3 F (36.8 C)   SpO2 100%     General appearance: alert, cooperative and appears stated age  Pelvic: External genitalia:  no lesions              Urethra:  normal appearing urethra with no masses, tenderness or lesions              Bartholins and Skenes: normal                 Vagina: normal appearing vagina with normal color and discharge, no lesions              Cervix: no lesions.  Blood in vagina.  IUD strings noted.                 Bimanual Exam:  Uterus:  normal size, contour, position, consistency, mobility, non-tender              Adnexa: no mass, fullness, tenderness      Chaperone was present for exam:  Marchelle Folks,  CMA  ASSESSMENT  Mirena IUD check up.  Metrorrhagia.    PLAN  We discussed bleeding profiles with Mirena.  We discussed options of removing Mirena IUD and observing cycles, removing Mirena IUD and taking periodic progesterone, or continuing with Mirena and suppressing menstruation with Aygestin temporarily.  She wills start Aygestin 5 mg daily.  #30, RF one.  We discussed side effects.  Fu with Dr. Oscar La in one month.    An After Visit Summary was printed and given to the patient.  37 min  total time was spent for this patient encounter, including preparation, face-to-face counseling with the patient, coordination of care, and documentation of the encounter.

## 2021-03-05 ENCOUNTER — Ambulatory Visit
Admission: RE | Admit: 2021-03-05 | Discharge: 2021-03-05 | Disposition: A | Discharge status: Home / Self Care | Payer: 59 | Source: Ambulatory Visit | Attending: Family Medicine | Admitting: Family Medicine

## 2021-03-05 ENCOUNTER — Other Ambulatory Visit: Payer: Self-pay

## 2021-03-05 DIAGNOSIS — Z1231 Encounter for screening mammogram for malignant neoplasm of breast: Secondary | ICD-10-CM

## 2021-03-24 ENCOUNTER — Telehealth: Payer: Self-pay

## 2021-03-24 NOTE — Telephone Encounter (Signed)
Called patient to confirm appointment for 03/25/21, patient canceled.   Appointment was for a 4 week follow up for "bleeding/spotting after surgery". Patietn stated she "no longer needed appointment, that problem has cleared up".   Routing to triage and Dr. Oscar La incase follow up is needed.

## 2021-03-25 ENCOUNTER — Ambulatory Visit: Payer: 59 | Admitting: Obstetrics and Gynecology

## 2021-04-14 ENCOUNTER — Ambulatory Visit
Admission: RE | Admit: 2021-04-14 | Discharge: 2021-04-14 | Disposition: A | Discharge status: Home / Self Care | Payer: 59 | Source: Ambulatory Visit | Attending: Emergency Medicine | Admitting: Emergency Medicine

## 2021-04-14 ENCOUNTER — Other Ambulatory Visit: Payer: Self-pay

## 2021-04-14 VITALS — BP 167/88 | HR 61 | Temp 98.2°F | Resp 16

## 2021-04-14 DIAGNOSIS — N898 Other specified noninflammatory disorders of vagina: Secondary | ICD-10-CM | POA: Diagnosis present

## 2021-04-14 DIAGNOSIS — E11628 Type 2 diabetes mellitus with other skin complications: Secondary | ICD-10-CM | POA: Diagnosis not present

## 2021-04-14 DIAGNOSIS — R82998 Other abnormal findings in urine: Secondary | ICD-10-CM | POA: Diagnosis present

## 2021-04-14 DIAGNOSIS — E118 Type 2 diabetes mellitus with unspecified complications: Secondary | ICD-10-CM | POA: Insufficient documentation

## 2021-04-14 DIAGNOSIS — R81 Glycosuria: Secondary | ICD-10-CM | POA: Diagnosis present

## 2021-04-14 DIAGNOSIS — B3731 Acute candidiasis of vulva and vagina: Secondary | ICD-10-CM | POA: Insufficient documentation

## 2021-04-14 LAB — POCT URINALYSIS DIP (MANUAL ENTRY)
Bilirubin, UA: NEGATIVE
Glucose, UA: >=1000 mg/dL — AB
Ketones, POC UA: NEGATIVE mg/dL
Nitrite, UA: NEGATIVE
Protein Ur, POC: NEGATIVE mg/dL
Spec Grav, UA: 1.015 (ref 1.010–1.025)
Urobilinogen, UA: 0.2 E.U./dL
pH, UA: 5 (ref 5.0–8.0)

## 2021-04-14 LAB — POCT URINE PREGNANCY: Preg Test, Ur: NEGATIVE

## 2021-04-14 MED ORDER — TERCONAZOLE 0.4 % VA CREA: TOPICAL_CREAM | VAGINAL | 0 refills | Status: DC

## 2021-04-14 MED ORDER — FLUCONAZOLE 150 MG PO TABS: ORAL_TABLET | ORAL | 0 refills | Status: DC

## 2021-04-14 NOTE — Discharge Instructions (Addendum)
For topical treatment of vaginal candidiasis (vaginal yeast infection), please begin terconazole cream for topical relief of your symptoms.  You can apply this cream to the labia minora and majora as well as intravaginally as needed to relieve symptoms of burning, itching and irritation.  For systemic treatment of vaginal candidiasis, please take fluconazole 150 mg as follows:  Take 1 tablet on day 1 (Saturday) Take 1 tablet on day 4 (Tuesday) Take 1 tablet on day 7 (Friday) Take 1 tablet on day 14 (Friday) Continue 1 tablet every 7 days (every Friday) until prescription is complete First 3 tablets that you take are for treatment, the weekly tablets are for prevention  Please follow-up with your gynecologist if your symptoms have not been completely relieved or return once you have completed the full weekly preventative treatment.  Another way to prevent vaginal yeast infections is to carefully monitor your diet with diabetes.  I have included some information about diabetes mellitus, adult nutrition and carbohydrate counting for you.  I wish you a safe journey and an enjoyable trip.  Thank you for visiting urgent care today.

## 2021-04-14 NOTE — ED Provider Notes (Signed)
[facility]    CSN: [id number] Arrival date & time: 04/14/21  1325    HISTORY  No chief complaint on file.  HPI Katherine Barr is a 45 y.o. female. Patient complains of vaginal itching and lower abdominal pain since Sunday.  Patient states she has been having more frequent vaginal yeast infections since she started taking Rybelsus for her diabetes.  Of note, urine dip today revealed 1000 of glucose.  Patient states she had some extra Diflucan on hand, took a tablet yesterday but states she does not feel that it has really helped.  The history is provided by the patient.  Past Medical History:  Diagnosis Date   Acid indigestion 09/07/2016   Anemia 10/24/2016   DM Type 2    GERD (gastroesophageal reflux disease)    Headache    History of kidney stones    HTN (hypertension)    Hyperlipidemia    Hypertension    Wears glasses    Patient Active Problem List   Diagnosis Date Noted   Hypertension associated with diabetes (HCC) 08/31/2020   Dyslipidemia associated with type 2 diabetes mellitus (HCC) 08/31/2020   Type 2 diabetes mellitus with hyperglycemia (HCC) 08/31/2020   Amenorrhea 03/30/2020   Insomnia 10/24/2016   Anemia 10/24/2016   HTN (hypertension) 09/07/2016   Acid indigestion 09/07/2016   Past Surgical History:  Procedure Laterality Date   BREAST BIOPSY Left 2014   BENIGN   CESAREAN SECTION WITH BILATERAL TUBAL LIGATION  2015   HYSTEROSCOPY WITH D & C N/A 01/18/2021   Procedure: DILATATION AND CURETTAGE /HYSTEROSCOPY WITH MYOSURE;  Surgeon: Romualdo Bolk, MD;  Location: [facility] [place];  Service: Gynecology;  Laterality: N/A;   INTRAUTERINE DEVICE (IUD) INSERTION N/A 01/18/2021   Procedure: Mirena INTRAUTERINE DEVICE (IUD) INSERTION;  Surgeon: Romualdo Bolk, MD;  Location: [facility] [place];  Service: Gynecology;  Laterality: N/A;   OB History     Gravida  6   Para  2   Term  2   Preterm  0   AB  4   Living   2      SAB  4   IAB  0   Ectopic  0   Multiple      Live Births  2          Home Medications    Prior to Admission medications   Medication Sig Start Date End Date Taking? Authorizing Provider  fluconazole (DIFLUCAN) 150 MG tablet Take 1 tablet tomorrow.  Take second tablet 3 days later.  Take third tablet 3 days after second tablet.  Take fourth tablet 1 week after third and continue taking 1 tablet weekly until prescription is complete. 04/14/21  Yes Theadora Rama Scales, PA-C  terconazole (TERAZOL 7) 0.4 % vaginal cream Apply twice daily to vulvovaginal area, can reapply after every void, use for 7 days as needed 04/14/21  Yes Theadora Rama Scales, PA-C  atorvastatin (LIPITOR) 10 MG tablet Take 1 tablet (10 mg total) by mouth daily. Patient taking differently: Take 10 mg by mouth every evening. 01/01/18   Loletta Specter, PA-C  B Complex Vitamins (VITAMIN B COMPLEX) TABS Take 1 tablet by mouth daily.    [provider]  fenofibrate 160 MG tablet Take 1 tablet (160 mg total) by mouth daily. Patient taking differently: Take 160 mg by mouth every evening. 08/31/20   Ardith Dark, MD  irbesartan (AVAPRO) 75 MG tablet Take 75 mg by mouth  at bedtime. 01/27/21   [provider]  MEGARED OMEGA-3 KRILL OIL 500 MG CAPS Take 1 capsule by mouth daily. 09/21/16   Clent Demark, PA-C  metFORMIN (GLUCOPHAGE-XR) 500 MG 24 hr tablet Take 2 tablets (1,000 mg total) by mouth 2 (two) times daily. 08/31/20   Vivi Barrack, MD  norethindrone (AYGESTIN) 5 MG tablet Take 1 tablet (5 mg total) by mouth daily. 02/24/21   Nunzio Cobbs, MD  propranolol (INDERAL) 10 MG tablet Take 20 mg by mouth 3 (three) times daily. 06/11/20   [provider]  Semaglutide (RYBELSUS) 7 MG TABS Take 7 mg by mouth daily. 12/01/20   Vivi Barrack, MD  sulfamethoxazole-trimethoprim (BACTRIM DS) 800-160 MG tablet Take 1 tablet by mouth 2 (two) times daily for 5 days. 04/15/21  04/20/21  Lynden Oxford Scales, PA-C  UNKNOWN TO PATIENT IMDUR unknown mg daily in pm per patient    [provider]   Family History Family History  Problem Relation Age of Onset   Diabetes Mother    Hypertension Father    Diabetes Brother    Hypertension Mother    Diabetes Brother    Stroke Brother    Social History Social History   Tobacco Use   Smoking status: Never   Smokeless tobacco: Never  Vaping Use   Vaping Use: Never used  Substance Use Topics   Alcohol use: Never   Drug use: Never   Allergies   Amlodipine and Lisinopril  Review of Systems Review of Systems Pertinent findings noted in history of present illness.   Physical Exam Triage Vital Signs ED Triage Vitals  Enc Vitals Group     BP 03/05/21 0827 (!) 147/82     Pulse Rate 03/05/21 0827 72     Resp 03/05/21 0827 18     Temp 03/05/21 0827 98.3 F (36.8 C)     Temp Source 03/05/21 0827 Oral     SpO2 03/05/21 0827 98 %     Weight --      Height --      Head Circumference --      Peak Flow --      Pain Score 03/05/21 0826 5     Pain Loc --      Pain Edu? --      Excl. in [place]? --   No data found.  Updated Vital Signs BP (!) 167/88 (BP Location: Left Arm)   Pulse 61   Temp 98.2 F (36.8 C) (Oral)   Resp 16   SpO2 98%   Physical Exam Vitals and nursing note reviewed.  Constitutional:      General: She is not in acute distress.    Appearance: Normal appearance. She is not ill-appearing.  HENT:     Head: Normocephalic and atraumatic.  Eyes:     General: Lids are normal.        Right eye: No discharge.        Left eye: No discharge.     Extraocular Movements: Extraocular movements intact.     Conjunctiva/sclera: Conjunctivae normal.     Right eye: Right conjunctiva is not injected.     Left eye: Left conjunctiva is not injected.  Neck:     Trachea: Trachea and phonation normal.  Cardiovascular:     Rate and Rhythm: Normal rate and regular rhythm.     Pulses: Normal pulses.      Heart sounds: Normal heart sounds. No murmur heard.  No friction rub. No gallop.  Pulmonary:     Effort: Pulmonary effort is normal. No accessory muscle usage, prolonged expiration or respiratory distress.     Breath sounds: Normal breath sounds. No stridor, decreased air movement or transmitted upper airway sounds. No decreased breath sounds, wheezing, rhonchi or rales.  Chest:     Chest wall: No tenderness.  Abdominal:     General: Abdomen is flat. Bowel sounds are normal. There is no distension.     Palpations: Abdomen is soft.     Tenderness: There is no abdominal tenderness (Mild, suprapubic). There is no right CVA tenderness or left CVA tenderness.     Hernia: No hernia is present.  Genitourinary:    Comments: Pt politely declines GU exam, pt did provide a swab for testing.   Musculoskeletal:        General: Normal range of motion.     Cervical back: Normal range of motion and neck supple. Normal range of motion.  Lymphadenopathy:     Cervical: No cervical adenopathy.  Skin:    General: Skin is warm and dry.     Findings: No erythema or rash.  Neurological:     General: No focal deficit present.     Mental Status: She is alert and oriented to person, place, and time.  Psychiatric:        Mood and Affect: Mood normal.        Behavior: Behavior normal.    Visual Acuity Right Eye Distance:   Left Eye Distance:   Bilateral Distance:    Right Eye Near:   Left Eye Near:    Bilateral Near:     UC Couse / Diagnostics / Procedures:    EKG  Radiology No results found.  Procedures Procedures (including critical care time)  UC Diagnoses / Final Clinical Impressions(s)   I have reviewed the triage vital signs and the nursing notes.  Pertinent labs & imaging results that were available during my care of the patient were reviewed by me and considered in my medical decision making (see chart for details).   Final diagnoses:  Vaginal discharge  Vaginal candidiasis   Type 2 diabetes mellitus with complication (HCC)  Glucosuria  Leukocytes in urine   Patient advised that the results of her vaginal swab will be made available to her once they are received, this typically takes 2 to 3 days and will be posted to her MyChart.  Patient also advised that she be contacted by phone if there are any positive findings.    In the meantime, patient was provided with a tube of terconazole cream to apply externally twice daily and post void for her comfort.  Patient will be treated empirically for presumed vaginal yeast infection with a 3 dose course of Diflucan 150 mg, every 72 hours, and then will follow up with a preventive dose of Diflucan 150 mg once weekly for a duration of 12 weeks.  Patient agrees to follow-up with her gynecologist at the end of this 12 weeks or sooner if symptoms or not completely well controlled.  Patient counseled on the importance of monitoring her carb intake not only with diabetes but also with diabetes treatment using Rybelsus.  Patient advised of the mechanism of action of Rybelsus and that this is the reason for the elevated glucose levels in her urine.  Patient was provided with information regarding carb counting and following a diabetic diet.  Of note: Immediately prior to the completion of this encounter  note, patient was contacted by me by phone to discuss findings of red blood cells and white blood cells in her urine.  I advised patient that I was sorry we did not discuss this during her visit.  Patient states she is continues to have some pelvic pressure and discomfort with urination however admits that her biggest concern when she was here yesterday was that she was having significant amount of vaginal discharge and very uncomfortable vaginal itching.  Patient and I agreed that it would be wise to treat her empirically for presumed lower urinary tract infection given that she is about to leave the country for a month.  Bactrim was sent to  her pharmacy, this is documented in a telephone note dated April 15, 2021.   ED Prescriptions     Medication Sig Dispense Auth. Provider   terconazole (TERAZOL 7) 0.4 % vaginal cream Apply twice daily to vulvovaginal area, can reapply after every void, use for 7 days as needed 45 g Lynden Oxford Scales, PA-C   fluconazole (DIFLUCAN) 150 MG tablet Take 1 tablet tomorrow.  Take second tablet 3 days later.  Take third tablet 3 days after second tablet.  Take fourth tablet 1 week after third and continue taking 1 tablet weekly until prescription is complete. 15 tablet Lynden Oxford Scales, PA-C      PDMP not reviewed this encounter.  Pending results:  Labs Reviewed  POCT URINALYSIS DIP (MANUAL ENTRY) - Abnormal; Notable for the following components:      Result Value   Glucose, UA >=1,000 (*)    Blood, UA trace-intact (*)    Leukocytes, UA Small (1+) (*)    All other components within normal limits  POCT URINE PREGNANCY  CERVICOVAGINAL ANCILLARY ONLY    Medications Ordered in UC: Medications - No data to display  Disposition Upon Discharge:  Condition: stable for discharge home  Patient presents today with concerns for exposure to sexually transmitted disease, requesting testing.  STD screening was performed as indicated.  Patient has been advised that the results of screening will be made available to them via MyChart and, if there are any positive findings, they will be contacted by phone, recommendations for treatment will be advised and prescriptions will be provided as indicated based on clinical guidelines.  Patient has also been advised that if treatment is recommended, they should abstain from sexual intercourse of all forms until treatment is complete.  Patient has further been advised that once treatment is complete, they have not had a complete resolution of their symptoms, if any, they should continue to abstain from sexual intercourse with all forms and follow-up with  her primary care provider or return to urgent care for repeat testing.  As such, the patient has been evaluated and assessed, work-up was performed and treatment was provided in alignment with urgent care protocols and evidence based medicine.  Patient/parent/caregiver has been advised that the patient may require follow up for further testing and/or treatment if the symptoms continue in spite of treatment, as clinically indicated and appropriate.  Routine symptom specific, illness specific and/or disease specific instructions were discussed with the patient and/or caregiver at length.  Prevention strategies for avoiding STD exposure were also discussed.  The patient will follow up with their current PCP if and as advised. If the patient does not currently have a PCP we will assist them in obtaining one.   The patient may need specialty follow up if the symptoms continue, in spite of conservative treatment  and management, for further workup, evaluation, consultation and treatment as clinically indicated and appropriate.  Patient/parent/caregiver verbalized understanding and agreement of plan as discussed.  All questions were addressed during visit.  Please see discharge instructions below for further details of plan.  Discharge Instructions:   Discharge Instructions      For topical treatment of vaginal candidiasis (vaginal yeast infection), please begin terconazole cream for topical relief of your symptoms.  You can apply this cream to the labia minora and majora as well as intravaginally as needed to relieve symptoms of burning, itching and irritation.  For systemic treatment of vaginal candidiasis, please take fluconazole 150 mg as follows:  Take 1 tablet on day 1 (Saturday) Take 1 tablet on day 4 (Tuesday) Take 1 tablet on day 7 (Friday) Take 1 tablet on day 14 (Friday) Continue 1 tablet every 7 days (every Friday) until prescription is complete First 3 tablets that you take are for  treatment, the weekly tablets are for prevention  Please follow-up with your gynecologist if your symptoms have not been completely relieved or return once you have completed the full weekly preventative treatment.  Another way to prevent vaginal yeast infections is to carefully monitor your diet with diabetes.  I have included some information about diabetes mellitus, adult nutrition and carbohydrate counting for you.  I wish you a safe journey and an enjoyable trip.  Thank you for visiting urgent care today.          Lynden Oxford Scales, [place] 04/15/21 [phone]

## 2021-04-14 NOTE — ED Triage Notes (Signed)
Pt reports having vaginal itching and lower abd pain since Sunday.

## 2021-04-15 ENCOUNTER — Encounter: Payer: Self-pay | Admitting: Emergency Medicine

## 2021-04-15 ENCOUNTER — Telehealth: Payer: Self-pay | Admitting: Emergency Medicine

## 2021-04-15 DIAGNOSIS — N390 Urinary tract infection, site not specified: Secondary | ICD-10-CM

## 2021-04-15 LAB — CERVICOVAGINAL ANCILLARY ONLY
Bacterial Vaginitis (gardnerella): NEGATIVE
Candida Glabrata: NEGATIVE
Candida Vaginitis: POSITIVE — AB
Chlamydia: NEGATIVE
Comment: NEGATIVE
Comment: NEGATIVE
Comment: NEGATIVE
Comment: NEGATIVE
Comment: NEGATIVE
Comment: NORMAL
Neisseria Gonorrhea: NEGATIVE
Trichomonas: NEGATIVE

## 2021-04-15 MED ORDER — SULFAMETHOXAZOLE-TRIMETHOPRIM 800-160 MG PO TABS: 1.0000 | ORAL_TABLET | Freq: Two times a day (BID) | ORAL | 0 refills | Status: AC

## 2021-04-15 NOTE — Telephone Encounter (Signed)
Patient contacted by provider today after following her encounter with [facility] urgent care.  Upon questioning, patient reports still having some pelvic pressure which was not addressed during the visit yesterday.  Given that patient had red blood cells and white blood cells in her urine sample, provider advised patient that it would be reasonable to treat her empirically for presumed lower urinary tract infection.  Patient reports that vaginal itching and vaginal discharge has somewhat decreased, states she decided to take the first dose of Diflucan today as opposed to waiting until Saturday as initially discussed.  Provider agreed with plan.  All questions addressed.  Bactrim has been sent to patient's pharmacy, patient advised to take 1 tablet twice daily for the next 5 days.  Patient also advised to follow-up as needed if she has not had complete resolution of her symptom of pelvic pressure or vaginal discharge is worsening.

## 2021-04-20 ENCOUNTER — Encounter: Payer: Self-pay | Admitting: Family Medicine

## 2021-04-20 ENCOUNTER — Ambulatory Visit (INDEPENDENT_AMBULATORY_CARE_PROVIDER_SITE_OTHER): Payer: 59 | Admitting: Family Medicine

## 2021-04-20 ENCOUNTER — Telehealth: Payer: Self-pay

## 2021-04-20 VITALS — BP 132/86 | HR 76 | Temp 98.3°F | Wt 134.6 lb

## 2021-04-20 DIAGNOSIS — B3749 Other urogenital candidiasis: Secondary | ICD-10-CM | POA: Diagnosis not present

## 2021-04-20 DIAGNOSIS — R1115 Cyclical vomiting syndrome unrelated to migraine: Secondary | ICD-10-CM | POA: Diagnosis not present

## 2021-04-20 DIAGNOSIS — R300 Dysuria: Secondary | ICD-10-CM

## 2021-04-20 DIAGNOSIS — E1165 Type 2 diabetes mellitus with hyperglycemia: Secondary | ICD-10-CM

## 2021-04-20 LAB — POCT URINALYSIS DIPSTICK
Bilirubin, UA: NEGATIVE
Blood, UA: POSITIVE
Glucose, UA: POSITIVE — AB
Ketones, UA: NEGATIVE
Leukocytes, UA: NEGATIVE
Nitrite, UA: NEGATIVE
Protein, UA: POSITIVE — AB
Spec Grav, UA: >=1.03 — AB (ref 1.010–1.025)
Urobilinogen, UA: 1 E.U./dL
pH, UA: 5.5 (ref 5.0–8.0)

## 2021-04-20 LAB — POCT GLYCOSYLATED HEMOGLOBIN (HGB A1C): Hemoglobin A1C: 10.7 % — AB (ref 4.0–5.6)

## 2021-04-20 MED ORDER — GLIPIZIDE 5 MG PO TABS: 5.0000 mg | ORAL_TABLET | Freq: Every day | ORAL | 3 refills | Status: DC

## 2021-04-20 MED ORDER — TERCONAZOLE 0.4 % VA CREA: 1.0000 | TOPICAL_CREAM | Freq: Every day | VAGINAL | 0 refills | Status: DC

## 2021-04-20 MED ORDER — ONDANSETRON HCL 4 MG PO TABS: 4.0000 mg | ORAL_TABLET | Freq: Three times a day (TID) | ORAL | 0 refills | Status: DC | PRN

## 2021-04-20 MED ORDER — NITROFURANTOIN MONOHYD MACRO 100 MG PO CAPS: 100.0000 mg | ORAL_CAPSULE | Freq: Two times a day (BID) | ORAL | 0 refills | Status: AC

## 2021-04-20 NOTE — Progress Notes (Signed)
Subjective:     Patient ID: Katherine Barr, female    DOB: 08/24/1975, 45 y.o.   MRN: [id number]  Chief Complaint  Patient presents with   Emesis   Headache    Ongoing for 4 days    HPI-pt is leaving for [place] tonight. Pt was seen in UC on 12/7-I reviewed notes and labs.  Pt never took the Bactrim and then "packed it somewhere"  Does still have some dysuria but has yeast infection. Denies frequency/abd pain/f/c.  Past 4-5 days, HA at HS-starts at [facility]. In am, vomits food-"like rice"from night before. Chronically only eats once/d as "Not hungry."  Stopped Rybelsus since last wk but took the trulicity again.  Vaginal yeast-only using the cream on outside.  Took diflucan but still d/c and itching "like crazy".  Has been getting a lot of yeast infections lately  DM type 2-A1C was 7 in July.  Pt not checking sugars.  No major changes in diet.  Cal husb: [id number] lab results per pt.  Health Maintenance Due  Topic Date Due   Hepatitis C Screening  Never done   FOOT EXAM  09/07/2017   Pneumococcal Vaccine 15-64 Years old (2 - PPSV23 if available, else PCV20) 05/15/2018   OPHTHALMOLOGY EXAM  12/15/2018   INFLUENZA VACCINE  12/07/2020    Past Medical History:  Diagnosis Date   Acid indigestion 09/07/2016   Anemia 10/24/2016   DM Type 2    GERD (gastroesophageal reflux disease)    Headache    History of kidney stones    HTN (hypertension)    Hyperlipidemia    Hypertension    Wears glasses     Past Surgical History:  Procedure Laterality Date   BREAST BIOPSY Left 2014   BENIGN   CESAREAN SECTION WITH BILATERAL TUBAL LIGATION  2015   HYSTEROSCOPY WITH D & C N/A 01/18/2021   Procedure: DILATATION AND CURETTAGE /HYSTEROSCOPY WITH MYOSURE;  Surgeon: Salvadore Dom, MD;  Location: [place];  Service: Gynecology;  Laterality: N/A;   INTRAUTERINE DEVICE (IUD) INSERTION N/A 01/18/2021   Procedure: Mirena INTRAUTERINE DEVICE (IUD) INSERTION;   Surgeon: Salvadore Dom, MD;  Location: [place];  Service: Gynecology;  Laterality: N/A;    Outpatient Medications Prior to Visit  Medication Sig Dispense Refill   atorvastatin (LIPITOR) 10 MG tablet Take 1 tablet (10 mg total) by mouth daily. (Patient taking differently: Take 10 mg by mouth every evening.) 90 tablet 1   B Complex Vitamins (VITAMIN B COMPLEX) TABS Take 1 tablet by mouth daily.     fenofibrate 160 MG tablet Take 1 tablet (160 mg total) by mouth daily. (Patient taking differently: Take 160 mg by mouth every evening.) 90 tablet 3   irbesartan (AVAPRO) 75 MG tablet Take 75 mg by mouth at bedtime.     MEGARED OMEGA-3 KRILL OIL 500 MG CAPS Take 1 capsule by mouth daily. 30 capsule 5   metFORMIN (GLUCOPHAGE-XR) 500 MG 24 hr tablet Take 2 tablets (1,000 mg total) by mouth 2 (two) times daily. 360 tablet 3   norethindrone (AYGESTIN) 5 MG tablet Take 1 tablet (5 mg total) by mouth daily. 30 tablet 1   propranolol (INDERAL) 10 MG tablet Take 20 mg by mouth 3 (three) times daily.     Semaglutide (RYBELSUS) 7 MG TABS Take 7 mg by mouth daily. 30 tablet 5   UNKNOWN TO PATIENT IMDUR unknown mg daily in pm per patient  terconazole (TERAZOL 7) 0.4 % vaginal cream Apply twice daily to vulvovaginal area, can reapply after every void, use for 7 days as needed 45 g 0   fluconazole (DIFLUCAN) 150 MG tablet Take 1 tablet tomorrow.  Take second tablet 3 days later.  Take third tablet 3 days after second tablet.  Take fourth tablet 1 week after third and continue taking 1 tablet weekly until prescription is complete. (Patient not taking: Reported on 04/20/2021) 15 tablet 0   sulfamethoxazole-trimethoprim (BACTRIM DS) 800-160 MG tablet Take 1 tablet by mouth 2 (two) times daily for 5 days. (Patient not taking: Reported on 04/20/2021) 10 tablet 0   No facility-administered medications prior to visit.    Allergies  Allergen Reactions   Amlodipine Swelling   Lisinopril Cough    [id number] except as noted in HPI      Objective:    Gen: WDWN NAD HEENT: NCAT, conjunctiva not injected, sclera nonicteric. Op moist NECK:  supple, no thyromegaly, no nodes, no carotid bruits CARDIAC: RRR, S1S2+, no murmur. DP 2+B LUNGS: CTAB. No wheezes ABDOMEN:  BS+, soft, NTND, No HSM, no masses EXT:  no edema MSK: no gross abnormalities.  NEURO: A&O x3.  CN II-XII intact.  PSYCH: normal mood. Good eye contact   BP 132/86    Pulse 76    Temp 98.3 F (36.8 C) (Temporal)    Wt 134 lb 9.6 oz (61.1 kg)    SpO2 96%    BMI 25.43 kg/m  Wt Readings from Last 3 Encounters:  04/20/21 134 lb 9.6 oz (61.1 kg)  01/27/21 139 lb (63 kg)  01/18/21 138 lb 6.4 oz (62.8 kg)   Results for orders placed or performed in visit on 04/20/21  POCT HgB A1C  Result Value Ref Range   Hemoglobin A1C 10.7 (A) 4.0 - 5.6 %   HbA1c POC (<> result, manual entry)     HbA1c, POC (prediabetic range)     HbA1c, POC (controlled diabetic range)     POC glucose 401    Assessment & Plan:   Problem List Items Addressed This Visit       Endocrine   Type 2 diabetes mellitus with hyperglycemia (HCC)   Relevant Medications   glipiZIDE (GLUCOTROL) 5 MG tablet   Other Relevant Orders   POCT HgB A1C (Completed)   Other Visit Diagnoses     Dysuria    -  Primary   Relevant Orders   POCT urinalysis dipstick   Urine Culture   Urinalysis, Routine w reflex microscopic   Candida infection of genital region       Relevant Medications   nitrofurantoin, macrocrystal-monohydrate, (MACROBID) 100 MG capsule      Dysuria-may be just from yeast.  Will check UA/cx.  Don't take bactrim as starting on SU.  Will do macrobid Yeast vaginitis-continue diflucan orally.  Add  terazole inserted into Vagina as well as externally(pt already packed it from the UC and didn't have vaginal applicator w/it) will do new rx..  Advised that the yeast may be from high sugars. DM type 2 uncontrolled w/hyperglycemia.   Advised to get a glucometer.  Will add glipizide-addressed concerns w/hypoglycemia but given sugars of 400-needs to be hydrated, and get them down.  Ideally, may need insulin but given travel to [place] tonight, will do SU instead.  Suspect HA from high sugars and vomiting in am as well-of course, can be mult other etiologies, but pt going out of country.  Pt will go back to  Trulicity as she feels sugars better on it in past and will stop semiglutide.  She has f/u in Jan so will get w/Dr. Jimmey Ralph on further mgmt.  She will see her doctor in [place] while there.   Vomiting-may be from elevated sugars, HA, gastroparesis, other.  Requesting zofran.  Limited w/u d/t pt leaving country  Meds ordered this encounter  Medications   terconazole (TERAZOL 7) 0.4 % vaginal cream    Sig: Place 1 applicator vaginally at bedtime.    Dispense:  45 g    Refill:  0   nitrofurantoin, macrocrystal-monohydrate, (MACROBID) 100 MG capsule    Sig: Take 1 capsule (100 mg total) by mouth 2 (two) times daily for 7 days.    Dispense:  14 capsule    Refill:  0   glipiZIDE (GLUCOTROL) 5 MG tablet    Sig: Take 1 tablet (5 mg total) by mouth daily before breakfast.    Dispense:  30 tablet    Refill:  3   ondansetron (ZOFRAN) 4 MG tablet    Sig: Take 1 tablet (4 mg total) by mouth every 8 (eight) hours as needed for nausea or vomiting.    Dispense:  30 tablet    Refill:  0    Angelena Sole., MD

## 2021-04-20 NOTE — Telephone Encounter (Signed)
Initial Comment Caller states, pt vomiting x4 days- not lot of urination in 8hrs.  04/20/2021 9:36:12 AM Attempt made - message left Lorin Picket, RNElnita Maxwell 04/20/2021 9:48:50 AM Attempt made - message left Lorin Picket, RN, Elnita Maxwell 04/20/2021 10:09:19 AM FINAL ATTEMPT MADE - message left Yes Lorin Picket, RN, Chery . Patient is scheduled for today

## 2021-04-20 NOTE — Patient Instructions (Addendum)
It was very nice to see you today!  Safe travels Don't take the Bactrim(sulfa). Take macrobid for urine, terconazole for yeast inside vagina, and glipizide for sugars. Please purchase a glucometer to check sugars.  See doctor in [place] if needed    PLEASE NOTE:  If you had any lab tests please let us know if you have not heard back within a few days. You may see your results on MyChart before we have a chance to review them but we will give you a call once they are reviewed by [place]. If we ordered any referrals today, please let us know if you have not heard from their office within the next week.   Please try these tips to maintain a healthy lifestyle:  Eat most of your calories during the day when you are active. Eliminate processed foods including packaged sweets (pies, cakes, cookies), reduce intake of potatoes, white bread, white pasta, and white rice. Look for whole grain options, oat flour or almond flour.  Each meal should contain half fruits/vegetables, one quarter protein, and one quarter carbs (no bigger than a computer mouse).  Cut down on sweet beverages. This includes juice, soda, and sweet tea. Also watch fruit intake, though this is a healthier sweet option, it still contains natural sugar! Limit to 3 servings daily.  Drink at least 1 glass of water with each meal and aim for at least 8 glasses per day  Exercise at least 150 minutes every week.

## 2021-04-21 LAB — URINE CULTURE
MICRO NUMBER:: 12750696
SPECIMEN QUALITY:: ADEQUATE

## 2021-04-21 LAB — URINALYSIS, ROUTINE W REFLEX MICROSCOPIC
Bilirubin Urine: NEGATIVE
Ketones, ur: NEGATIVE
Leukocytes,Ua: NEGATIVE
Nitrite: NEGATIVE
RBC / HPF: NONE SEEN (ref 0–?)
Specific Gravity, Urine: >=1.03 — AB (ref 1.000–1.030)
Urine Glucose: 500 — AB
Urobilinogen, UA: 0.2 (ref 0.0–1.0)
WBC, UA: NONE SEEN (ref 0–?)
pH: 5.5 (ref 5.0–8.0)

## 2021-04-21 NOTE — Telephone Encounter (Signed)
Noted. Pt seen by Dr. Ruthine Dose 04/20/2021

## 2021-04-22 NOTE — Progress Notes (Signed)
Spoke to husband.  Urine culture is negative so can stop macrobid.  He couldn't answer how her sugars are doing.  I told him she needs to see a doctor in [place] if sugars still high or not feeling well

## 2021-06-04 ENCOUNTER — Other Ambulatory Visit: Payer: Self-pay

## 2021-06-04 ENCOUNTER — Encounter: Payer: Self-pay | Admitting: Family Medicine

## 2021-06-04 ENCOUNTER — Ambulatory Visit (INDEPENDENT_AMBULATORY_CARE_PROVIDER_SITE_OTHER): Payer: 59 | Admitting: Family Medicine

## 2021-06-04 VITALS — BP 144/76 | HR 70 | Temp 98.5°F | Ht 61.0 in | Wt 138.8 lb

## 2021-06-04 DIAGNOSIS — R8290 Unspecified abnormal findings in urine: Secondary | ICD-10-CM

## 2021-06-04 DIAGNOSIS — E1159 Type 2 diabetes mellitus with other circulatory complications: Secondary | ICD-10-CM | POA: Diagnosis not present

## 2021-06-04 DIAGNOSIS — I152 Hypertension secondary to endocrine disorders: Secondary | ICD-10-CM

## 2021-06-04 DIAGNOSIS — E1165 Type 2 diabetes mellitus with hyperglycemia: Secondary | ICD-10-CM | POA: Diagnosis not present

## 2021-06-04 DIAGNOSIS — I10 Essential (primary) hypertension: Secondary | ICD-10-CM

## 2021-06-04 LAB — POCT GLYCOSYLATED HEMOGLOBIN (HGB A1C): Hemoglobin A1C: 8.7 % — AB (ref 4.0–5.6)

## 2021-06-04 MED ORDER — IRBESARTAN 150 MG PO TABS: 150.0000 mg | ORAL_TABLET | Freq: Every day | ORAL | 3 refills | Status: DC

## 2021-06-04 NOTE — Assessment & Plan Note (Signed)
A1c improving to 8.7.  She just recently started back on Trulicity.  We will continue her current regimen of metformin 1000 mg daily, Trulicity 3 mg weekly, and glipizide 5 mg daily.  Recheck A1c 3 months.  We discussed lifestyle modifications.

## 2021-06-04 NOTE — Progress Notes (Signed)
° °  Katherine Barr is a 46 y.o. female who presents today for an office visit.  Assessment/Plan:  New/Acute Problems: Neck pain No red flags.  Likely mild strain.  She will continue home exercises.  She can use over-the-counter meds.  Also recommend heating pad  Left heel pain Recommended good arch support.  She can use over-the-counter meds as needed.  May consider referral to sports medicine if this continues to be an issue  Cloudy urine We will check urine culture.  No other signs of UTI.  Chronic Problems Addressed Today: Hypertension associated with diabetes (HCC) Above goal.  We will increase irbesartan to 150 mg once daily.  Continue propranolol 20 mg 3 times daily.  Type 2 diabetes mellitus with hyperglycemia (HCC) A1c improving to 8.7.  She just recently started back on Trulicity.  We will continue her current regimen of metformin 1000 mg daily, Trulicity 3 mg weekly, and glipizide 5 mg daily.  Recheck A1c 3 months.  We discussed lifestyle modifications.     Subjective:  HPI:  See A/P for status of chronic conditions.  Since her last visit she was seen by different provider.  Had A1c elevated above 10.  She was started on glipizide.  She has not been taking Rybelsus.  Apparently she went to [place] a month ago and was found to have high sugar there as well.  She has been restarted on her Trulicity.  She is taking 3 mg weekly.  She has been doing this for the last few weeks.  She has also had neck pain for last couple of days.  Worse with certain motions.  No reported weakness or numbness.  Also with left heel pain.  This has been going on for weeks to months.  Was intermittent nature but now is persistent.  No obvious aggravating or alleviating factors.       Objective:  Physical Exam: BP (!) 144/76 (BP Location: Left Arm, Patient Position: Sitting, Cuff Size: Normal)    Pulse 70    Temp 98.5 F (36.9 C) (Temporal)    Ht 5\' 1"  (1.549 m)    Wt 138 lb 12.8 oz (63 kg)     SpO2 99%    BMI 26.23 kg/m   Gen: No acute distress, resting comfortably CV: Regular rate and rhythm with no murmurs appreciated Pulm: Normal work of breathing, clear to auscultation bilaterally with no crackles, wheezes, or rhonchi MSK: -Neck: No deformities.  No midline tenderness.  Tenderness palpation along bilateral cervical paraspinal muscles - Left foot: No deformities.  Tenderness palpation along lateral edges of calcaneus neurovascular intact distally. Neuro: Grossly normal, moves all extremities Psych: Normal affect and thought content      Itzia Cunliffe M. , MD 06/04/2021 1:48 PM

## 2021-06-04 NOTE — Patient Instructions (Signed)
It was very nice to see you today!  Your A1c is 8.7.  This is better but still not quite at goal.  Please continue taking the glipizide 5 mg daily, Trulicity 3 mg weekly, and metformin 1000 mg twice daily.  We can recheck your A1c in 3 months.  We will increase your dose of irbesartan to 150 mg daily.  Please keep an eye on your blood pressure and let me know if it is persistently 140/90 or higher.  We will check a urine sample today.  I will see back in 3 months.  Please come back to see me sooner if needed.  Take care, Dr Jerline Pain  PLEASE NOTE:  If you had any lab tests please let us know if you have not heard back within a few days. You may see your results on mychart before we have a chance to review them but we will give you a call once they are reviewed by [place]. If we ordered any referrals today, please let us know if you have not heard from their office within the next week.   Please try these tips to maintain a healthy lifestyle:  Eat at least 3 REAL meals and 1-2 snacks per day.  Aim for no more than 5 hours between eating.  If you eat breakfast, please do so within one hour of getting up.   Each meal should contain half fruits/vegetables, one quarter protein, and one quarter carbs (no bigger than a computer mouse)  Cut down on sweet beverages. This includes juice, soda, and sweet tea.   Drink at least 1 glass of water with each meal and aim for at least 8 glasses per day  Exercise at least 150 minutes every week.

## 2021-06-04 NOTE — Assessment & Plan Note (Signed)
Above goal.  We will increase irbesartan to 150 mg once daily.  Continue propranolol 20 mg 3 times daily.

## 2021-06-04 NOTE — Addendum Note (Signed)
Addended by: Karren Cobble on: 06/04/2021 02:14 PM   Modules accepted: Orders

## 2021-06-06 LAB — URINE CULTURE
MICRO NUMBER:: 12930006
SPECIMEN QUALITY:: ADEQUATE

## 2021-06-07 NOTE — Progress Notes (Signed)
Please inform patient of the following:  Her urine culture shows UTI. Ok to send in keflex 500mg  bid x 7 days. She should continue to drink plenty of water. Would like for her to let us know if her symptoms are not improving.  Algis Greenhouse. Jerline Pain, MD 06/07/2021 8:09 AM

## 2021-06-08 ENCOUNTER — Other Ambulatory Visit: Payer: Self-pay

## 2021-06-08 MED ORDER — CEPHALEXIN 500 MG PO CAPS: 500.0000 mg | ORAL_CAPSULE | Freq: Two times a day (BID) | ORAL | 0 refills | Status: AC

## 2021-07-08 ENCOUNTER — Other Ambulatory Visit: Payer: Self-pay | Admitting: Family Medicine

## 2021-08-03 ENCOUNTER — Other Ambulatory Visit: Payer: Self-pay

## 2021-08-03 ENCOUNTER — Encounter: Payer: Self-pay | Admitting: Obstetrics and Gynecology

## 2021-08-03 ENCOUNTER — Ambulatory Visit (INDEPENDENT_AMBULATORY_CARE_PROVIDER_SITE_OTHER): Payer: Self-pay | Admitting: Obstetrics and Gynecology

## 2021-08-03 VITALS — BP 122/66 | HR 77 | Ht 61.0 in | Wt 143.0 lb

## 2021-08-03 DIAGNOSIS — Z30431 Encounter for routine checking of intrauterine contraceptive device: Secondary | ICD-10-CM

## 2021-08-03 DIAGNOSIS — R232 Flushing: Secondary | ICD-10-CM

## 2021-08-03 DIAGNOSIS — R61 Generalized hyperhidrosis: Secondary | ICD-10-CM

## 2021-08-03 NOTE — Progress Notes (Signed)
GYNECOLOGY  VISIT ?  ?HPI: ?46 y.o.   Legally Separated Asian Not Hispanic or Latino  female   ?[id number] with No LMP recorded. (Menstrual status: IUD).   ?here for  IUD removal   ?She was seen by her primary for headache and nausea. She was diagnosed with a UTI (no symptoms), her glucose of 400. She was under the impression that the IUD gave her the UTI and is here to have the IUD removed. ? ?She had the mirena IUD inserted 9/22 at the time of hysteroscopy, polypectomy and D&C done for AUB. She had one cycle after the surgery in the fall, nothing since then. Pathology was benign. The IUD was placed for endometrial protection and treatment of AUB. ? ?She has gained 5 lbs since 9/22. Feels it is from the IUD.  ? ?She c/o a 2-3 month h/o hot flashes, night sweats. Mostly tolerable.  ? ?GYNECOLOGIC HISTORY: ?No LMP recorded. (Menstrual status: IUD). ?Contraception:IUD ?Menopausal hormone therapy: none  ?       ?OB History   ? ? Gravida  ?6  ? Para  ?2  ? Term  ?2  ? Preterm  ?0  ? AB  ?4  ? Living  ?2  ?  ? ? SAB  ?4  ? IAB  ?0  ? Ectopic  ?0  ? Multiple  ?   ? Live Births  ?2  ?   ?  ?  ?    ? ?Patient Active Problem List  ? Diagnosis Date Noted  ? Hypertension associated with diabetes (HCC) 08/31/2020  ? Dyslipidemia associated with type 2 diabetes mellitus (HCC) 08/31/2020  ? Type 2 diabetes mellitus with hyperglycemia (HCC) 08/31/2020  ? Amenorrhea 03/30/2020  ? Insomnia 10/24/2016  ? Anemia 10/24/2016  ? Acid indigestion 09/07/2016  ? ? ?Past Medical History:  ?Diagnosis Date  ? Acid indigestion 09/07/2016  ? Anemia 10/24/2016  ? DM Type 2   ? GERD (gastroesophageal reflux disease)   ? Headache   ? History of kidney stones   ? HTN (hypertension)   ? Hyperlipidemia   ? Hypertension   ? Wears glasses   ? ? ?Past Surgical History:  ?Procedure Laterality Date  ? BREAST BIOPSY Left 2014  ? BENIGN  ? CESAREAN SECTION WITH BILATERAL TUBAL LIGATION  2015  ? HYSTEROSCOPY WITH D & C N/A 01/18/2021  ? Procedure: DILATATION AND  CURETTAGE /HYSTEROSCOPY WITH MYOSURE;  Surgeon: Romualdo Bolk, MD;  Location: [facility];  Service: Gynecology;  Laterality: N/A;  ? INTRAUTERINE DEVICE (IUD) INSERTION N/A 01/18/2021  ? Procedure: Mirena INTRAUTERINE DEVICE (IUD) INSERTION;  Surgeon: Romualdo Bolk, MD;  Location[facility];  Service: Gynecology;  Laterality: N/A;  ? ? ?Current Outpatient Medications  ?Medication Sig Dispense Refill  ? atorvastatin (LIPITOR) 10 MG tablet Take 1 tablet (10 mg total) by mouth daily. (Patient taking differently: Take 10 mg by mouth every evening.) 90 tablet 1  ? B Complex Vitamins (VITAMIN B COMPLEX) TABS Take 1 tablet by mouth daily.    ? fenofibrate 160 MG tablet Take 1 tablet (160 mg total) by mouth daily. (Patient taking differently: Take 160 mg by mouth every evening.) 90 tablet 3  ? fluconazole (DIFLUCAN) 150 MG tablet Take 1 tablet tomorrow.  Take second tablet 3 days later.  Take third tablet 3 days after second tablet.  Take fourth tablet 1 week after third and continue taking 1 tablet weekly until prescription is complete. 15  tablet 0  ? glipiZIDE (GLUCOTROL) 5 MG tablet TAKE 1 TABLET BY MOUTH DAILY BEFORE BREAKFAST. 90 tablet 1  ? irbesartan (AVAPRO) 150 MG tablet Take 1 tablet (150 mg total) by mouth daily. 90 tablet 3  ? MEGARED OMEGA-3 KRILL OIL 500 MG CAPS Take 1 capsule by mouth daily. 30 capsule 5  ? metFORMIN (GLUCOPHAGE-XR) 500 MG 24 hr tablet Take 2 tablets (1,000 mg total) by mouth 2 (two) times daily. 360 tablet 3  ? norethindrone (AYGESTIN) 5 MG tablet Take 1 tablet (5 mg total) by mouth daily. 30 tablet 1  ? ondansetron (ZOFRAN) 4 MG tablet Take 1 tablet (4 mg total) by mouth every 8 (eight) hours as needed for nausea or vomiting. 30 tablet 0  ? propranolol (INDERAL) 10 MG tablet Take 20 mg by mouth 3 (three) times daily.    ? terconazole (TERAZOL 7) 0.4 % vaginal cream Place 1 applicator vaginally at bedtime. 45 g 0  ? UNKNOWN TO PATIENT IMDUR  unknown mg daily in pm per patient    ? ?No current facility-administered medications for this visit.  ?  ? ?ALLERGIES: Amlodipine and Lisinopril ? ?Family History  ?Problem Relation Age of Onset  ? Diabetes Mother   ? Hypertension Father   ? Diabetes Brother   ? Hypertension Mother   ? Diabetes Brother   ? Stroke Brother   ? ? ?Social History  ? ?Socioeconomic History  ? Marital status: Legally Separated  ?  Spouse name: Not on file  ? Number of children: Not on file  ? Years of education: Not on file  ? Highest education level: Not on file  ?Occupational History  ? Not on file  ?Tobacco Use  ? Smoking status: Never  ? Smokeless tobacco: Never  ?Vaping Use  ? Vaping Use: Never used  ?Substance and Sexual Activity  ? Alcohol use: Never  ? Drug use: Never  ? Sexual activity: Yes  ?  Partners: Male  ?  Birth control/protection: Surgical  ?  Comment: btl  ?Other Topics Concern  ? Not on file  ?Social History Narrative  ? ** Merged History Encounter **  ?    ? ?Social Determinants of Health  ? ?Financial Resource Strain: Not on file  ?Food Insecurity: Not on file  ?Transportation Needs: Not on file  ?Physical Activity: Not on file  ?Stress: Not on file  ?Social Connections: Not on file  ?Intimate Partner Violence: Not on file  ? ? ?ROS ? ?PHYSICAL EXAMINATION:   ? ?BP 122/66   Pulse 77   Ht 5\' 1"  (1.549 m)   Wt 143 lb (64.9 kg)   SpO2 100%   BMI 27.02 kg/m?     ?General appearance: alert, cooperative and appears stated age ? ?1. Hot flashes ?Requests lab work. Not cycling with the IUD ?- Follicle stimulating hormone ? ?2. Night sweats ?See above ?- Follicle stimulating hormone ? ?3. IUD check up ?The patient had the IUD placed last fall at the time of hysteroscopy, polypectomy and D&C for AUB. Pathology was benign.  ?-She has developed amenorrhea with the IUD, reassured that this is normal.  ?-She wanted the IUD removed because she felt it caused her to develop a UTI, we discussed that the IUD should not cause  UTI's. I encouraged her to keep the IUD for protection of her uterine lining. She will keep it for now. ? ?

## 2021-08-04 LAB — FOLLICLE STIMULATING HORMONE: FSH: 21.2 m[IU]/mL

## 2021-08-10 ENCOUNTER — Other Ambulatory Visit: Payer: Self-pay | Admitting: Family Medicine

## 2021-08-10 ENCOUNTER — Telehealth: Payer: Self-pay

## 2021-08-10 ENCOUNTER — Encounter: Payer: Self-pay | Admitting: Family Medicine

## 2021-08-10 ENCOUNTER — Ambulatory Visit (INDEPENDENT_AMBULATORY_CARE_PROVIDER_SITE_OTHER): Payer: 59 | Admitting: Family Medicine

## 2021-08-10 VITALS — BP 130/82 | HR 83 | Temp 98.1°F | Ht 61.0 in | Wt 140.2 lb

## 2021-08-10 DIAGNOSIS — R1084 Generalized abdominal pain: Secondary | ICD-10-CM | POA: Diagnosis not present

## 2021-08-10 DIAGNOSIS — E1165 Type 2 diabetes mellitus with hyperglycemia: Secondary | ICD-10-CM

## 2021-08-10 DIAGNOSIS — M545 Low back pain, unspecified: Secondary | ICD-10-CM

## 2021-08-10 LAB — POCT URINALYSIS DIPSTICK
Bilirubin, UA: NEGATIVE
Blood, UA: NEGATIVE
Glucose, UA: NEGATIVE
Ketones, UA: NEGATIVE
Leukocytes, UA: NEGATIVE
Nitrite, UA: NEGATIVE
Protein, UA: NEGATIVE
Spec Grav, UA: 1.015 (ref 1.010–1.025)
Urobilinogen, UA: 0.2 E.U./dL
pH, UA: 6.5 (ref 5.0–8.0)

## 2021-08-10 MED ORDER — TRULICITY 0.75 MG/0.5ML ~~LOC~~ SOAJ: 0.7500 mg | SUBCUTANEOUS | 0 refills | Status: DC

## 2021-08-10 NOTE — Telephone Encounter (Signed)
Ok to schedule TOC with Dr Ruthine Dose  ?

## 2021-08-10 NOTE — Telephone Encounter (Signed)
Called insurance to get PA started. Spoke with Vonna Kotyk, PA completed over the phone. Pending decision, 72 hour turn around, will receive fax with decision.  ?

## 2021-08-10 NOTE — Telephone Encounter (Signed)
Patient wants to see if she can Transfer to Dr. Ruthine Dose from Dr.Parker. Patient states she would like to have a female provider. Please advise.  ?

## 2021-08-10 NOTE — Progress Notes (Signed)
? ?Subjective:  ? ? ? Patient ID: Katherine Barr, female    DOB: [birth year], 46 y.o.   MRN: [id number] ? ?Chief Complaint  ?Patient presents with  ? Headache  ?  Started 2 days ago  ? Abdominal Pain  ? Back Pain  ?  Lower back pain that started 2 days, worse on yesterday  ? Nausea  ? ? ?HPI ?In past, trulicity-makes feel badly.  3mg .   No ETOH.  Getts itching at injection site.   ?Took Trulicity on 4/2.  Also took 3 wks ago and felt badly so skipped 2 wks. Then took on 4/2 and feeling badly-HA, nausea,ab pain, back pain.  Heaviness lower abd.  Urinary freq, small amts.  No dysuria.  Just feels poorly. ? ?HA for 2 days ?Abd pain ?LBP ?Nausea ?No f/c. ? ?Health Maintenance Due  ?Topic Date Due  ? Hepatitis C Screening  Never done  ? FOOT EXAM  09/07/2017  ? OPHTHALMOLOGY EXAM  12/15/2018  ? COLONOSCOPY (Pts 45-41yrs Insurance coverage will need to be confirmed)  Never done  ? ? ?Past Medical History:  ?Diagnosis Date  ? Acid indigestion 09/07/2016  ? Anemia 10/24/2016  ? DM Type 2   ? GERD (gastroesophageal reflux disease)   ? Headache   ? History of kidney stones   ? HTN (hypertension)   ? Hyperlipidemia   ? Hypertension   ? Wears glasses   ? ? ?Past Surgical History:  ?Procedure Laterality Date  ? BREAST BIOPSY Left 2014  ? BENIGN  ? CESAREAN SECTION WITH BILATERAL TUBAL LIGATION  2015  ? HYSTEROSCOPY WITH D & C N/A 01/18/2021  ? Procedure: DILATATION AND CURETTAGE /HYSTEROSCOPY WITH MYOSURE;  Surgeon: Salvadore Dom, MD;  Location: [facility];  Service: Gynecology;  Laterality: N/A;  ? INTRAUTERINE DEVICE (IUD) INSERTION N/A 01/18/2021  ? Procedure: Mirena INTRAUTERINE DEVICE (IUD) INSERTION;  Surgeon: Salvadore Dom, MD;  Location: [facility];  Service: Gynecology;  Laterality: N/A;  ? ? ?Outpatient Medications Prior to Visit  ?Medication Sig Dispense Refill  ? atorvastatin (LIPITOR) 10 MG tablet Take 1 tablet (10 mg total) by mouth daily. (Patient taking differently:  Take 10 mg by mouth every evening.) 90 tablet 1  ? B Complex Vitamins (VITAMIN B COMPLEX) TABS Take 1 tablet by mouth daily.    ? fenofibrate 160 MG tablet Take 1 tablet (160 mg total) by mouth daily. (Patient taking differently: Take 160 mg by mouth every evening.) 90 tablet 3  ? glipiZIDE (GLUCOTROL) 5 MG tablet TAKE 1 TABLET BY MOUTH DAILY BEFORE BREAKFAST. 90 tablet 1  ? irbesartan (AVAPRO) 150 MG tablet Take 1 tablet (150 mg total) by mouth daily. 90 tablet 3  ? MEGARED OMEGA-3 KRILL OIL 500 MG CAPS Take 1 capsule by mouth daily. 30 capsule 5  ? metFORMIN (GLUCOPHAGE-XR) 500 MG 24 hr tablet Take 2 tablets (1,000 mg total) by mouth 2 (two) times daily. 360 tablet 3  ? norethindrone (AYGESTIN) 5 MG tablet Take 1 tablet (5 mg total) by mouth daily. 30 tablet 1  ? ondansetron (ZOFRAN) 4 MG tablet Take 1 tablet (4 mg total) by mouth every 8 (eight) hours as needed for nausea or vomiting. 30 tablet 0  ? propranolol (INDERAL) 10 MG tablet Take 20 mg by mouth 3 (three) times daily.    ? terconazole (TERAZOL 7) 0.4 % vaginal cream Place 1 applicator vaginally at bedtime. 45 g 0  ? UNKNOWN TO PATIENT IMDUR  unknown mg daily in pm per patient    ? fluconazole (DIFLUCAN) 150 MG tablet Take 1 tablet tomorrow.  Take second tablet 3 days later.  Take third tablet 3 days after second tablet.  Take fourth tablet 1 week after third and continue taking 1 tablet weekly until prescription is complete. (Patient not taking: Reported on 08/10/2021) 15 tablet 0  ? ?No facility-administered medications prior to visit.  ? ? ?Allergies  ?Allergen Reactions  ? Amlodipine Swelling  ? Lisinopril Cough  ? ?ROS neg/noncontributory except as noted HPI/below ? ? ?   ?Objective:  ?  ? ?BP 130/82   Pulse 83   Temp 98.1 ?F (36.7 ?C) (Temporal)   Ht 5\' 1"  (1.549 m)   Wt 140 lb 4 oz (63.6 kg)   SpO2 98%   BMI 26.50 kg/m?  ?Wt Readings from Last 3 Encounters:  ?08/10/21 140 lb 4 oz (63.6 kg)  ?08/03/21 143 lb (64.9 kg)  ?06/04/21 138 lb 12.8 oz (63  kg)  ? ? ?Physical Exam  ? ?Gen: WDWN NAD ?HEENT: NCAT, conjunctiva not injected, sclera nonicteric ?NECK:  supple, no thyromegaly, no nodes, no carotid bruits ?CARDIAC: RRR, S1S2+, no murmur. DP 2+B ?LUNGS: CTAB. No wheezes ?ABDOMEN:  BS+, soft, NTND, No HSM, no masses. No cvat ?EXT:  no edema ?MSK: no gross abnormalities.  ?NEURO: A&O x3.  CN II-XII intact.  ?PSYCH: normal mood. Good eye contact ? ?Results for orders placed or performed in visit on 08/10/21  ?POCT urinalysis dipstick  ?Result Value Ref Range  ? Color, UA YELLOW   ? Clarity, UA CLEAR   ? Glucose, UA Negative Negative  ? Bilirubin, UA NEGATIVE   ? Ketones, UA NEGATIVE   ? Spec Grav, UA 1.015 1.010 - 1.025  ? Blood, UA NEGATIVE   ? pH, UA 6.5 5.0 - 8.0  ? Protein, UA Negative Negative  ? Urobilinogen, UA 0.2 0.2 or 1.0 E.U./dL  ? Nitrite, UA NEGATIVE   ? Leukocytes, UA Negative Negative  ? Appearance    ? Odor    ?  ? ?   ?Assessment & Plan:  ? ?Problem List Items Addressed This Visit   ? ?  ? Endocrine  ? Type 2 diabetes mellitus with hyperglycemia (HCC)  ? Relevant Medications  ? Dulaglutide (TRULICITY) [id number] SOPN  ? ?Other Visit Diagnoses   ? ? Bilateral low back pain without sciatica, unspecified chronicity    -  Primary  ? Relevant Orders  ? POCT urinalysis dipstick (Completed)  ? Urine Culture  ? Generalized abdominal pain      ? Relevant Orders  ? POCT urinalysis dipstick (Completed)  ? Urine Culture  ? ?  ? Abd pain-suspect from high dose trulicity(pt states on 3 mg).  Inconsistent use as well.  Discussed need to be consistent and work way up to dose.  So, will go back to 75 for 4 wks and see how dose before increase.  May need to change to another in class.  UA neg but await cx(doubt uti) ?LBP-seems to be related to the tulicity.  Will decrease dose and see if can work back up ?DM type 2-not controlled-hyperglycemia-will be consistent w/meds-see above.  Has f/u later this month ? ?Meds ordered this encounter  ?Medications  ?  Dulaglutide (TRULICITY) [id number] SOPN  ?  Sig: Inject 0.75 mg into the skin once a week.  ?  Dispense:  2 mL  ?  Refill:  0  ? ? ?  Wellington Hampshire, MD ? ?

## 2021-08-10 NOTE — Telephone Encounter (Signed)
Ok with me. ? ?Katina Degree. Jimmey Ralph, MD ?08/10/2021 8:38 AM  ? ?

## 2021-08-10 NOTE — Patient Instructions (Signed)
It was very nice to see you today! ? ?Go back to trulicity 75 for 4 weeks.  If doing ok, will increase.  But take weekly and don't miss doses.  Can use thigh to inject or stomach ? ? ?PLEASE NOTE: ? ?If you had any lab tests please let us know if you have not heard back within a few days. You may see your results on MyChart before we have a chance to review them but we will give you a call once they are reviewed by [place]. If we ordered any referrals today, please let us know if you have not heard from their office within the next week.  ? ?Please try these tips to maintain a healthy lifestyle: ? ?Eat most of your calories during the day when you are active. Eliminate processed foods including packaged sweets (pies, cakes, cookies), reduce intake of potatoes, white bread, white pasta, and white rice. Look for whole grain options, oat flour or almond flour. ? ?Each meal should contain half fruits/vegetables, one quarter protein, and one quarter carbs (no bigger than a computer mouse). ? ?Cut down on sweet beverages. This includes juice, soda, and sweet tea. Also watch fruit intake, though this is a healthier sweet option, it still contains natural sugar! Limit to 3 servings daily. ? ?Drink at least 1 glass of water with each meal and aim for at least 8 glasses per day ? ?Exercise at least 150 minutes every week.   ?

## 2021-08-12 ENCOUNTER — Other Ambulatory Visit: Payer: Self-pay | Admitting: *Deleted

## 2021-08-12 MED ORDER — CEPHALEXIN 500 MG PO CAPS: 500.0000 mg | ORAL_CAPSULE | Freq: Two times a day (BID) | ORAL | 0 refills | Status: AC

## 2021-08-13 LAB — URINE CULTURE
MICRO NUMBER:: 13219989
SPECIMEN QUALITY:: ADEQUATE

## 2021-09-02 ENCOUNTER — Ambulatory Visit: Payer: 59 | Admitting: Family Medicine

## 2021-09-09 ENCOUNTER — Encounter: Payer: Medicaid Other | Admitting: Family Medicine

## 2021-09-20 ENCOUNTER — Ambulatory Visit (INDEPENDENT_AMBULATORY_CARE_PROVIDER_SITE_OTHER): Payer: 59 | Admitting: Family Medicine

## 2021-09-20 ENCOUNTER — Encounter: Payer: Self-pay | Admitting: Family Medicine

## 2021-09-20 VITALS — BP 130/66 | HR 64 | Temp 97.7°F | Ht 61.0 in | Wt 144.1 lb

## 2021-09-20 DIAGNOSIS — E1159 Type 2 diabetes mellitus with other circulatory complications: Secondary | ICD-10-CM | POA: Diagnosis not present

## 2021-09-20 DIAGNOSIS — E1165 Type 2 diabetes mellitus with hyperglycemia: Secondary | ICD-10-CM | POA: Diagnosis not present

## 2021-09-20 DIAGNOSIS — E785 Hyperlipidemia, unspecified: Secondary | ICD-10-CM

## 2021-09-20 DIAGNOSIS — E1169 Type 2 diabetes mellitus with other specified complication: Secondary | ICD-10-CM

## 2021-09-20 DIAGNOSIS — I152 Hypertension secondary to endocrine disorders: Secondary | ICD-10-CM | POA: Diagnosis not present

## 2021-09-20 DIAGNOSIS — Z760 Encounter for issue of repeat prescription: Secondary | ICD-10-CM | POA: Diagnosis not present

## 2021-09-20 LAB — CBC WITH DIFFERENTIAL/PLATELET
Basophils Absolute: 0.1 K/uL (ref 0.0–0.1)
Basophils Relative: 0.9 % (ref 0.0–3.0)
Eosinophils Absolute: 0.2 K/uL (ref 0.0–0.7)
Eosinophils Relative: 2.2 % (ref 0.0–5.0)
HCT: 36.6 % (ref 36.0–46.0)
Hemoglobin: 12.2 g/dL (ref 12.0–15.0)
Lymphocytes Relative: 28 % (ref 12.0–46.0)
Lymphs Abs: 2.5 K/uL (ref 0.7–4.0)
MCHC: 33.3 g/dL (ref 30.0–36.0)
MCV: 84.3 fl (ref 78.0–100.0)
Monocytes Absolute: 0.4 K/uL (ref 0.1–1.0)
Monocytes Relative: 4.5 % (ref 3.0–12.0)
Neutro Abs: 5.8 K/uL (ref 1.4–7.7)
Neutrophils Relative %: 64.4 % (ref 43.0–77.0)
Platelets: 355 K/uL (ref 150.0–400.0)
RBC: 4.35 Mil/uL (ref 3.87–5.11)
RDW: 13.1 % (ref 11.5–15.5)
WBC: 9 K/uL (ref 4.0–10.5)

## 2021-09-20 LAB — TSH: TSH: 1.98 u[IU]/mL (ref 0.35–5.50)

## 2021-09-20 LAB — VITAMIN B12: Vitamin B-12: 345 pg/mL (ref 211–911)

## 2021-09-20 MED ORDER — ATORVASTATIN CALCIUM 10 MG PO TABS: 10.0000 mg | ORAL_TABLET | Freq: Every evening | ORAL | 3 refills | Status: AC

## 2021-09-20 MED ORDER — IRBESARTAN 150 MG PO TABS: 150.0000 mg | ORAL_TABLET | Freq: Every day | ORAL | 3 refills | Status: AC

## 2021-09-20 MED ORDER — PROPRANOLOL HCL 10 MG PO TABS: 20.0000 mg | ORAL_TABLET | Freq: Three times a day (TID) | ORAL | 3 refills | Status: DC

## 2021-09-20 MED ORDER — FENOFIBRATE 160 MG PO TABS: 160.0000 mg | ORAL_TABLET | Freq: Every evening | ORAL | 3 refills | Status: AC

## 2021-09-20 MED ORDER — PROPRANOLOL HCL 20 MG PO TABS: 20.0000 mg | ORAL_TABLET | Freq: Three times a day (TID) | ORAL | 3 refills | Status: AC

## 2021-09-20 MED ORDER — METFORMIN HCL ER 500 MG PO TB24: 1000.0000 mg | ORAL_TABLET | Freq: Two times a day (BID) | ORAL | 3 refills | Status: AC

## 2021-09-20 MED ORDER — GLIPIZIDE 5 MG PO TABS: 5.0000 mg | ORAL_TABLET | Freq: Every day | ORAL | 1 refills | Status: DC

## 2021-09-20 NOTE — Progress Notes (Signed)
? ?Subjective:  ? ? ? Patient ID: Katherine Barr, female    DOB: March 01, 1976, 46 y.o.   MRN: [id number] ? ?Chief Complaint  ?Patient presents with  ? Transfer of Care  ?  Would like A1C checked  ? ? ?HPI ?TOC from Dr. Otilio Jefferson female ? DM type 2-trulicity [id number] take it-pharm not have.  Feeling ok.  On glipizide and metformin.  Not checking sugars.  Working more diet.  Not exercising.  No n/t.  Ophth Jan in [place].  No cp/palp. ?HTN-120's/80's.  No cough/sob/cp/palp/edema.  Occ ha. ? ?Health Maintenance Due  ?Topic Date Due  ? Hepatitis C Screening  Never done  ? FOOT EXAM  09/07/2017  ? OPHTHALMOLOGY EXAM  12/15/2018  ? COLONOSCOPY (Pts 45-30yrs Insurance coverage will need to be confirmed)  Never done  ? ? ?Past Medical History:  ?Diagnosis Date  ? Acid indigestion 09/07/2016  ? Anemia 10/24/2016  ? DM Type 2   ? GERD (gastroesophageal reflux disease)   ? Headache   ? History of kidney stones   ? HTN (hypertension)   ? Hyperlipidemia   ? Hypertension   ? Wears glasses   ? ? ?Past Surgical History:  ?Procedure Laterality Date  ? BREAST BIOPSY Left 2014  ? BENIGN  ? CESAREAN SECTION WITH BILATERAL TUBAL LIGATION  2015  ? HYSTEROSCOPY WITH D & C N/A 01/18/2021  ? Procedure: DILATATION AND CURETTAGE /HYSTEROSCOPY WITH MYOSURE;  Surgeon: Salvadore Dom, MD;  Location: [facility];  Service: Gynecology;  Laterality: N/A;  ? INTRAUTERINE DEVICE (IUD) INSERTION N/A 01/18/2021  ? Procedure: Mirena INTRAUTERINE DEVICE (IUD) INSERTION;  Surgeon: Salvadore Dom, MD;  Location: [facility];  Service: Gynecology;  Laterality: N/A;  ? ? ?Outpatient Medications Prior to Visit  ?Medication Sig Dispense Refill  ? B Complex Vitamins (VITAMIN B COMPLEX) TABS Take 1 tablet by mouth daily.    ? fluconazole (DIFLUCAN) 150 MG tablet Take 1 tablet tomorrow.  Take second tablet 3 days later.  Take third tablet 3 days after second tablet.  Take fourth tablet 1 week after third and continue  taking 1 tablet weekly until prescription is complete. 15 tablet 0  ? MEGARED OMEGA-3 KRILL OIL 500 MG CAPS Take 1 capsule by mouth daily. 30 capsule 5  ? ondansetron (ZOFRAN) 4 MG tablet Take 1 tablet (4 mg total) by mouth every 8 (eight) hours as needed for nausea or vomiting. 30 tablet 0  ? terconazole (TERAZOL 7) 0.4 % vaginal cream Place 1 applicator vaginally at bedtime. 45 g 0  ? UNKNOWN TO PATIENT IMDUR unknown mg daily in pm per patient    ? atorvastatin (LIPITOR) 10 MG tablet Take 1 tablet (10 mg total) by mouth daily. (Patient taking differently: Take 10 mg by mouth every evening.) 90 tablet 1  ? fenofibrate 160 MG tablet Take 1 tablet (160 mg total) by mouth daily. (Patient taking differently: Take 160 mg by mouth every evening.) 90 tablet 3  ? glipiZIDE (GLUCOTROL) 5 MG tablet TAKE 1 TABLET BY MOUTH DAILY BEFORE BREAKFAST. 90 tablet 1  ? irbesartan (AVAPRO) 150 MG tablet Take 1 tablet (150 mg total) by mouth daily. 90 tablet 3  ? metFORMIN (GLUCOPHAGE-XR) 500 MG 24 hr tablet Take 2 tablets (1,000 mg total) by mouth 2 (two) times daily. 360 tablet 3  ? norethindrone (AYGESTIN) 5 MG tablet Take 1 tablet (5 mg total) by mouth daily. 30 tablet 1  ? propranolol (INDERAL) 10 MG  tablet Take 20 mg by mouth 3 (three) times daily.    ? TRULICITY [id number] SOPN INJECT 0.75 MG SUBCUTANEOUSLY ONE TIME PER WEEK (Patient not taking: Reported on 09/20/2021) 2 mL 1  ? ?No facility-administered medications prior to visit.  ? ? ?Allergies  ?Allergen Reactions  ? Amlodipine Swelling  ? Lisinopril Cough  ? ?ROS neg/noncontributory except as noted HPI/below ?Irreg menses-gyn placed IUD.  Pt has TL. Pt w/irreg menses.  Doesn't want it.  Was irreg.  ? ? ?   ?Objective:  ?  ? ?BP 130/66   Pulse 64   Temp 97.7 ?F (36.5 ?C) (Temporal)   Ht 5\' 1"  (1.549 m)   Wt 144 lb 2 oz (65.4 kg)   SpO2 99%   BMI 27.23 kg/m?  ?Wt Readings from Last 3 Encounters:  ?09/20/21 144 lb 2 oz (65.4 kg)  ?08/10/21 140 lb 4 oz (63.6 kg)   ?08/03/21 143 lb (64.9 kg)  ? ? ?Physical Exam  ? ?Gen: WDWN NAD ?HEENT: NCAT, conjunctiva not injected, sclera nonicteric ?NECK:  supple, no thyromegaly, no nodes, no carotid bruits ?CARDIAC: RRR, S1S2+, no murmur. DP 2+B ?LUNGS: CTAB. No wheezes ?ABDOMEN:  BS+, soft, NTND, No HSM, no masses ?EXT:  no edema ?MSK: no gross abnormalities.  ?NEURO: A&O x3.  CN II-XII intact.  ?PSYCH: normal mood. Good eye contact ? ?   ?Assessment & Plan:  ? ?Problem List Items Addressed This Visit   ? ?  ? Cardiovascular and Mediastinum  ? Hypertension associated with diabetes ([place])  ? Relevant Medications  ? atorvastatin (LIPITOR) 10 MG tablet  ? fenofibrate 160 MG tablet  ? glipiZIDE (GLUCOTROL) 5 MG tablet  ? irbesartan (AVAPRO) 150 MG tablet  ? metFORMIN (GLUCOPHAGE-XR) 500 MG 24 hr tablet  ? propranolol (INDERAL) 10 MG tablet  ? Other Relevant Orders  ? CBC with Differential/Platelet  ? Comprehensive metabolic panel  ? Hemoglobin A1c  ? Lipid panel  ? TSH  ? Vitamin B12  ? Microalbumin / creatinine urine ratio  ?  ? Endocrine  ? Dyslipidemia associated with type 2 diabetes mellitus ([place])  ? Relevant Medications  ? atorvastatin (LIPITOR) 10 MG tablet  ? fenofibrate 160 MG tablet  ? glipiZIDE (GLUCOTROL) 5 MG tablet  ? irbesartan (AVAPRO) 150 MG tablet  ? metFORMIN (GLUCOPHAGE-XR) 500 MG 24 hr tablet  ? Other Relevant Orders  ? Lipid panel  ? Type 2 diabetes mellitus with hyperglycemia (HCC) - Primary  ? Relevant Medications  ? atorvastatin (LIPITOR) 10 MG tablet  ? glipiZIDE (GLUCOTROL) 5 MG tablet  ? irbesartan (AVAPRO) 150 MG tablet  ? metFORMIN (GLUCOPHAGE-XR) 500 MG 24 hr tablet  ? Other Relevant Orders  ? CBC with Differential/Platelet  ? Comprehensive metabolic panel  ? Hemoglobin A1c  ? Lipid panel  ? TSH  ? Vitamin B12  ? Microalbumin / creatinine urine ratio  ? ?Other Visit Diagnoses   ? ? Medication refill      ? Relevant Medications  ? atorvastatin (LIPITOR) 10 MG tablet  ? ?  ?1  DM type 2-chronic.  Uncontrolled  w/hyperglycemia-long discussion on TLC/complications/goals.  Needs to be compliant w/tx!Marland Kitchen  She will bring copy of eye exam.  Check a1c,B12,cmp,lipids,cbc,tsh,microalb/creat ratio.  F/u 3 mo.  Continue glipizide 5mg  and metformin 1000 bid ?2.  HTN-chronic. Controlled. Cont propranolol 20mg  tid and avapro 150mg  daily ?3.  HLD-chronic.  ? Controlled.  Cont atorvastatin 10mg , TLC.  Check lipids, lft's ? ? ?Meds ordered this encounter  ?  Medications  ? atorvastatin (LIPITOR) 10 MG tablet  ?  Sig: Take 1 tablet (10 mg total) by mouth every evening.  ?  Dispense:  90 tablet  ?  Refill:  3  ? fenofibrate 160 MG tablet  ?  Sig: Take 1 tablet (160 mg total) by mouth every evening.  ?  Dispense:  90 tablet  ?  Refill:  3  ? glipiZIDE (GLUCOTROL) 5 MG tablet  ?  Sig: Take 1 tablet (5 mg total) by mouth daily before breakfast.  ?  Dispense:  90 tablet  ?  Refill:  1  ? irbesartan (AVAPRO) 150 MG tablet  ?  Sig: Take 1 tablet (150 mg total) by mouth daily.  ?  Dispense:  90 tablet  ?  Refill:  3  ? metFORMIN (GLUCOPHAGE-XR) 500 MG 24 hr tablet  ?  Sig: Take 2 tablets (1,000 mg total) by mouth 2 (two) times daily.  ?  Dispense:  360 tablet  ?  Refill:  3  ? propranolol (INDERAL) 10 MG tablet  ?  Sig: Take 2 tablets (20 mg total) by mouth 3 (three) times daily.  ?  Dispense:  270 tablet  ?  Refill:  3  ? ? ?Wellington Hampshire, MD ? ?

## 2021-09-20 NOTE — Patient Instructions (Signed)
It was very nice to see you today! ? ?Work on exercise 62minutes/day 5 days/week ? ? ?PLEASE NOTE: ? ?If you had any lab tests please let us know if you have not heard back within a few days. You may see your results on MyChart before we have a chance to review them but we will give you a call once they are reviewed by [place]. If we ordered any referrals today, please let us know if you have not heard from their office within the next week.  ? ?Please try these tips to maintain a healthy lifestyle: ? ?Eat most of your calories during the day when you are active. Eliminate processed foods including packaged sweets (pies, cakes, cookies), reduce intake of potatoes, white bread, white pasta, and white rice. Look for whole grain options, oat flour or almond flour. ? ?Each meal should contain half fruits/vegetables, one quarter protein, and one quarter carbs (no bigger than a computer mouse). ? ?Cut down on sweet beverages. This includes juice, soda, and sweet tea. Also watch fruit intake, though this is a healthier sweet option, it still contains natural sugar! Limit to 3 servings daily. ? ?Drink at least 1 glass of water with each meal and aim for at least 8 glasses per day ? ?Exercise at least 150 minutes every week.   ?

## 2021-09-21 LAB — HEMOGLOBIN A1C: Hgb A1c MFr Bld: 8.2 % — ABNORMAL HIGH (ref 4.6–6.5)

## 2021-09-21 LAB — COMPREHENSIVE METABOLIC PANEL
ALT: 14 U/L (ref 0–35)
AST: 13 U/L (ref 0–37)
Albumin: 4.4 g/dL (ref 3.5–5.2)
Alkaline Phosphatase: 48 U/L (ref 39–117)
BUN: 11 mg/dL (ref 6–23)
CO2: 19 mEq/L (ref 19–32)
Calcium: 9.8 mg/dL (ref 8.4–10.5)
Chloride: 104 mEq/L (ref 96–112)
Creatinine, Ser: 0.6 mg/dL (ref 0.40–1.20)
GFR: 108.61 mL/min (ref >60.00)
Glucose, Bld: 175 mg/dL — ABNORMAL HIGH (ref 70–99)
Potassium: 3.9 mEq/L (ref 3.5–5.1)
Sodium: 139 mEq/L (ref 135–145)
Total Bilirubin: 0.3 mg/dL (ref 0.2–1.2)
Total Protein: 7.3 g/dL (ref 6.0–8.3)

## 2021-09-21 LAB — LIPID PANEL
Cholesterol: 108 mg/dL (ref 0–200)
HDL: 35.9 mg/dL — ABNORMAL LOW (ref >39.00)
LDL Cholesterol: 46 mg/dL (ref 0–99)
NonHDL: 72.46
Total CHOL/HDL Ratio: 3
Triglycerides: 134 mg/dL (ref 0.0–149.0)
VLDL: 26.8 mg/dL (ref 0.0–40.0)

## 2021-09-21 LAB — MICROALBUMIN / CREATININE URINE RATIO
Creatinine,U: 84.5 mg/dL
Microalb Creat Ratio: 5.5 mg/g (ref 0.0–30.0)
Microalb, Ur: 4.7 mg/dL — ABNORMAL HIGH (ref 0.0–1.9)

## 2021-11-12 IMAGING — MG MM DIGITAL SCREENING BILAT W/ TOMO AND CAD
6 of 10 series · 6 of 30 positions shown · non-contrast
Comparison: Previous exam(s).

CLINICAL DATA: Screening.

EXAM:
DIGITAL SCREENING BILATERAL MAMMOGRAM WITH TOMOSYNTHESIS AND CAD
TECHNIQUE: Bilateral screening digital craniocaudal and mediolateral oblique
mammograms were obtained. Bilateral screening digital breast
tomosynthesis was performed. The images were evaluated with
computer-aided detection.

[L CC synth-2D (1 of 2)]
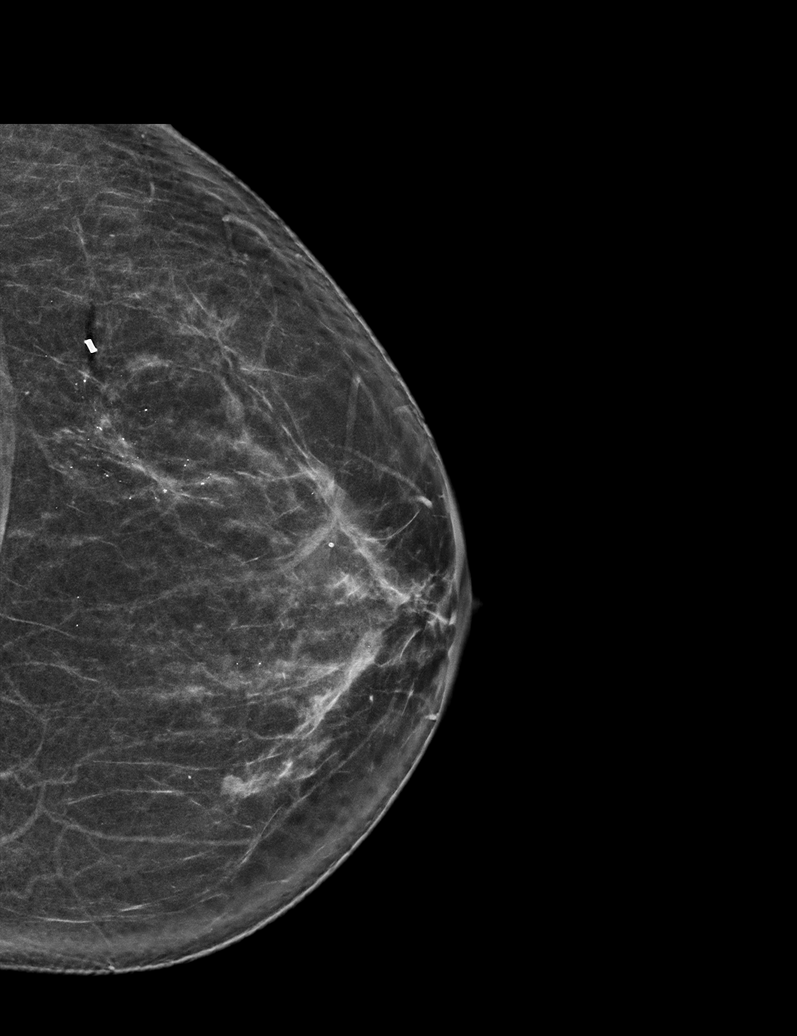

[L MLO synth-2D]
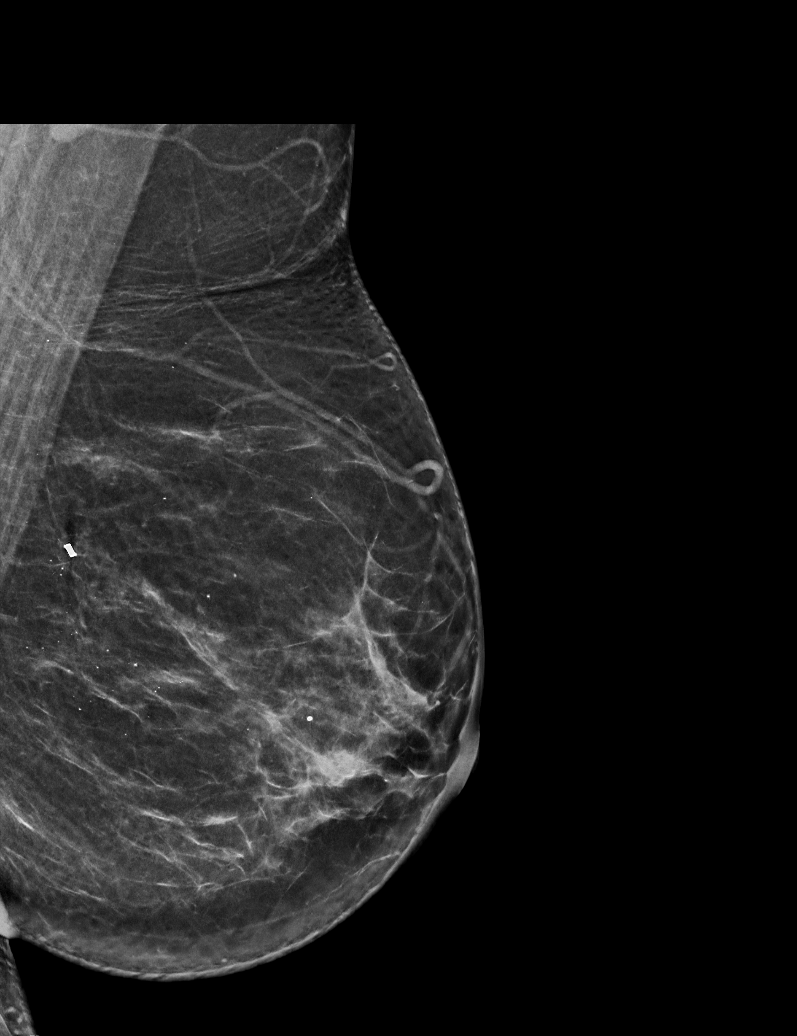

[L CC synth-2D (2 of 2)]
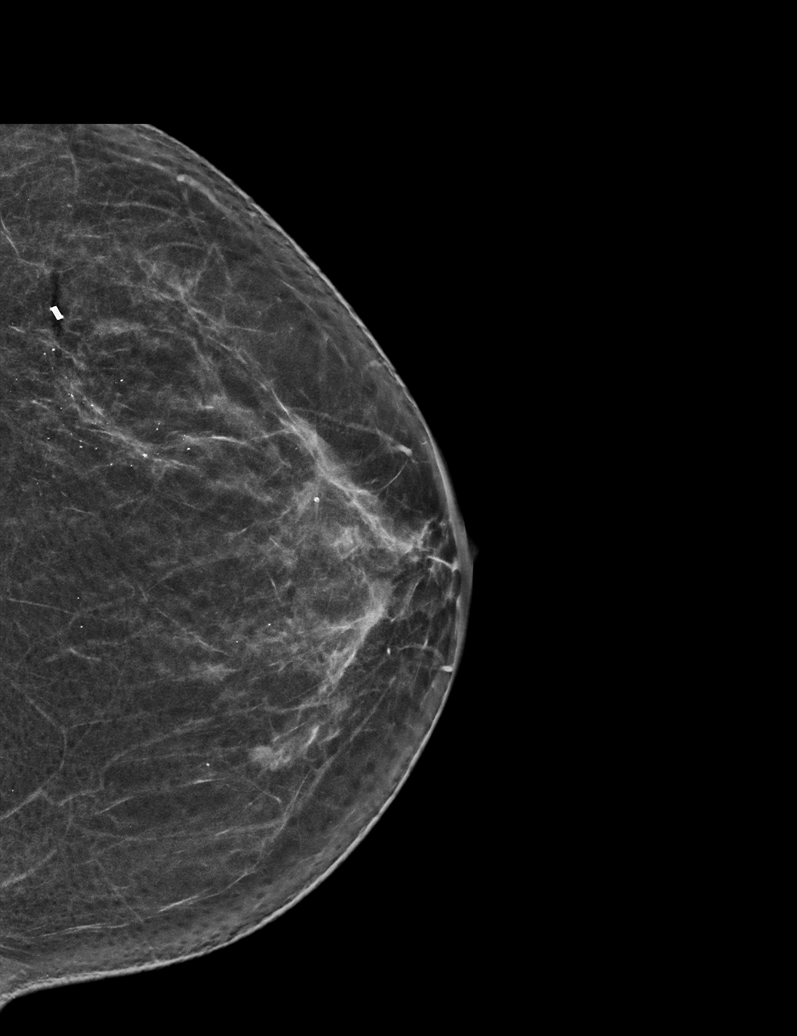

[R MLO synth-2D]
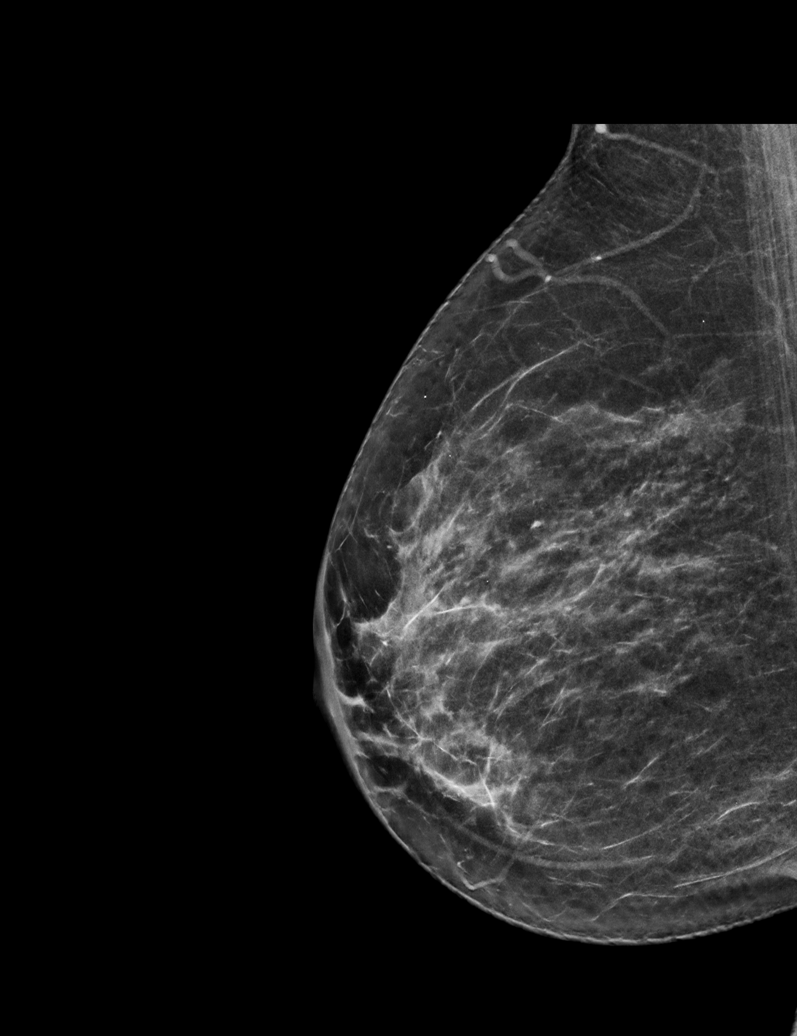

[R CC synth-2D]
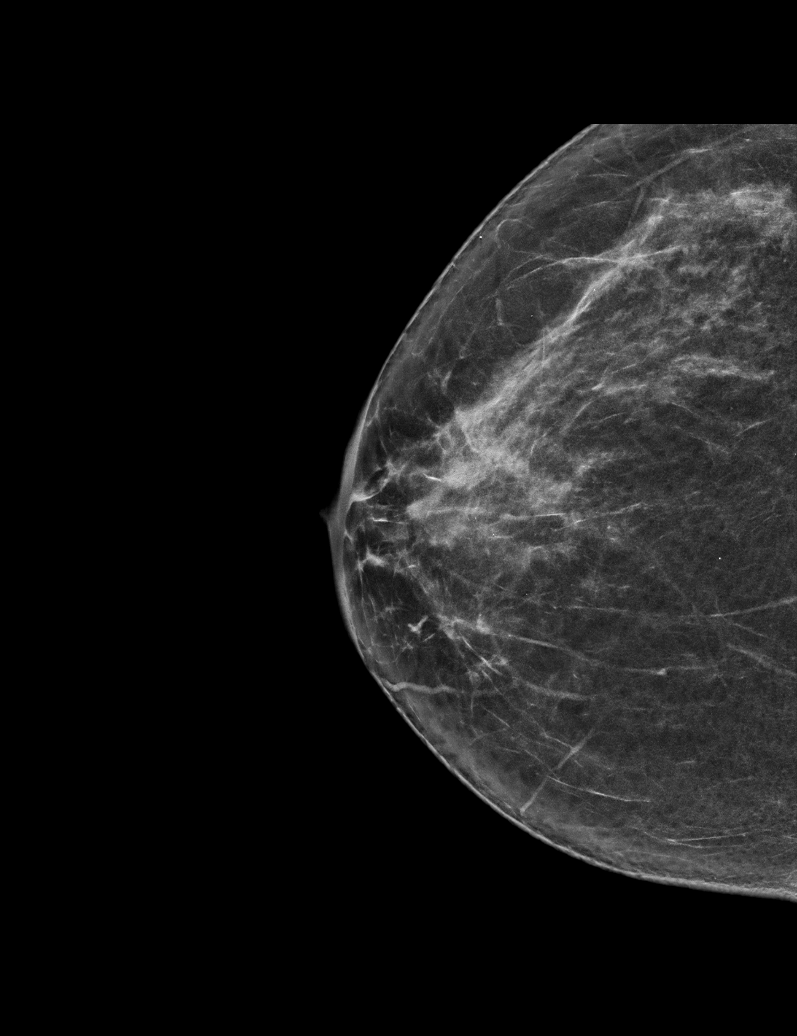

[L MLO tomo · tomo slice 35/68.0]
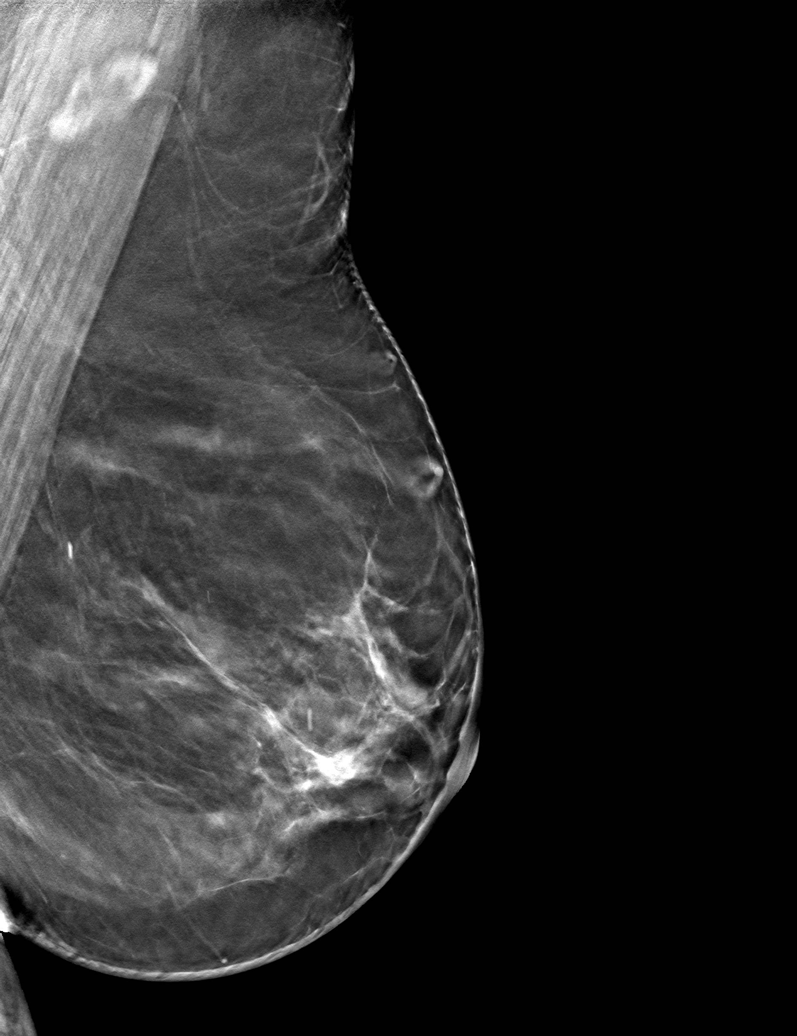

[6 of 30 positions shown; findings below may reference images not displayed]

ACR Breast Density Category b: There are scattered areas of
fibroglandular density.
FINDINGS: There are no findings suspicious for malignancy.
IMPRESSION: No mammographic evidence of malignancy. A result letter of this
screening mammogram will be mailed directly to the patient.

RECOMMENDATION:
Screening mammogram in one year. (Code:51-O-LD2)

BI-RADS CATEGORY  1: Negative.

## 2021-12-15 ENCOUNTER — Ambulatory Visit (INDEPENDENT_AMBULATORY_CARE_PROVIDER_SITE_OTHER): Payer: 59 | Admitting: Family Medicine

## 2021-12-15 ENCOUNTER — Encounter: Payer: Self-pay | Admitting: Family Medicine

## 2021-12-15 VITALS — BP 136/83 | HR 64 | Temp 98.1°F | Ht 61.0 in | Wt 142.2 lb

## 2021-12-15 DIAGNOSIS — E1165 Type 2 diabetes mellitus with hyperglycemia: Secondary | ICD-10-CM | POA: Diagnosis not present

## 2021-12-15 DIAGNOSIS — I152 Hypertension secondary to endocrine disorders: Secondary | ICD-10-CM | POA: Diagnosis not present

## 2021-12-15 DIAGNOSIS — E1159 Type 2 diabetes mellitus with other circulatory complications: Secondary | ICD-10-CM | POA: Diagnosis not present

## 2021-12-15 LAB — CBC
HCT: 36.7 % (ref 36.0–46.0)
Hemoglobin: 12.1 g/dL (ref 12.0–15.0)
MCHC: 32.9 g/dL (ref 30.0–36.0)
MCV: 82.5 fl (ref 78.0–100.0)
Platelets: 304 10*3/uL (ref 150.0–400.0)
RBC: 4.45 Mil/uL (ref 3.87–5.11)
RDW: 12.5 % (ref 11.5–15.5)
WBC: 7.7 10*3/uL (ref 4.0–10.5)

## 2021-12-15 LAB — COMPREHENSIVE METABOLIC PANEL
ALT: 15 U/L (ref 0–35)
AST: 16 U/L (ref 0–37)
Albumin: 4.4 g/dL (ref 3.5–5.2)
Alkaline Phosphatase: 49 U/L (ref 39–117)
BUN: 10 mg/dL (ref 6–23)
CO2: 26 mEq/L (ref 19–32)
Calcium: 10.1 mg/dL (ref 8.4–10.5)
Chloride: 100 mEq/L (ref 96–112)
Creatinine, Ser: 0.64 mg/dL (ref 0.40–1.20)
GFR: 106.76 mL/min (ref >60.00)
Glucose, Bld: 292 mg/dL — ABNORMAL HIGH (ref 70–99)
Potassium: 4.8 mEq/L (ref 3.5–5.1)
Sodium: 134 mEq/L — ABNORMAL LOW (ref 135–145)
Total Bilirubin: 0.6 mg/dL (ref 0.2–1.2)
Total Protein: 7.1 g/dL (ref 6.0–8.3)

## 2021-12-15 LAB — TSH: TSH: 1.97 u[IU]/mL (ref 0.35–5.50)

## 2021-12-15 LAB — POCT GLYCOSYLATED HEMOGLOBIN (HGB A1C): Hemoglobin A1C: 10.9 % — AB (ref 4.0–5.6)

## 2021-12-15 LAB — VITAMIN B12: Vitamin B-12: 908 pg/mL (ref 211–911)

## 2021-12-15 MED ORDER — TIRZEPATIDE 2.5 MG/0.5ML ~~LOC~~ SOAJ: 2.5000 mg | SUBCUTANEOUS | 0 refills | Status: DC

## 2021-12-15 MED ORDER — TIRZEPATIDE 5 MG/0.5ML ~~LOC~~ SOAJ: 5.0000 mg | SUBCUTANEOUS | 0 refills | Status: DC

## 2021-12-15 NOTE — Assessment & Plan Note (Signed)
A1c uncontrolled at 10.9.  She is currently on metformin 1000 mg daily and glipizide 5 mg daily.  She has had some adverse reactions to Trulicity and Jardiance in the past.  We discussed additional medication options.  Will try Mounjaro 2.5 mg weekly for 4 weeks and then she can increase to 5 mg weekly.  We discussed lifestyle modifications.  She will need to come back in 3 months to recheck A1c.

## 2021-12-15 NOTE — Patient Instructions (Signed)
It was very nice to see you today!  Your A1c today is elevated.  Please start the Mounjaro 2.5 mg weekly for 4 weeks and then increase to 5 mg weekly.  Will check blood work today.  Please continue to work on diet and exercise.  Please come back in 3 months to recheck.  Take care, Dr Jimmey Ralph  PLEASE NOTE:  If you had any lab tests please let us know if you have not heard back within a few days. You may see your results on mychart before we have a chance to review them but we will give you a call once they are reviewed by [place]. If we ordered any referrals today, please let us know if you have not heard from their office within the next week.   Please try these tips to maintain a healthy lifestyle:  Eat at least 3 REAL meals and 1-2 snacks per day.  Aim for no more than 5 hours between eating.  If you eat breakfast, please do so within one hour of getting up.   Each meal should contain half fruits/vegetables, one quarter protein, and one quarter carbs (no bigger than a computer mouse)  Cut down on sweet beverages. This includes juice, soda, and sweet tea.   Drink at least 1 glass of water with each meal and aim for at least 8 glasses per day  Exercise at least 150 minutes every week.

## 2021-12-15 NOTE — Progress Notes (Signed)
   Katherine Barr is a 46 y.o. female who presents today for an office visit.  Assessment/Plan:  Chronic Problems Addressed Today: Type 2 diabetes mellitus with hyperglycemia (HCC) A1c uncontrolled at 10.9.  She is currently on metformin 1000 mg daily and glipizide 5 mg daily.  She has had some adverse reactions to Trulicity and Jardiance in the past.  We discussed additional medication options.  Will try Mounjaro 2.5 mg weekly for 4 weeks and then she can increase to 5 mg weekly.  We discussed lifestyle modifications.  She will need to come back in 3 months to recheck A1c.  Hypertension associated with diabetes (HCC) Pressure at goal on irbesartan 150 mg daily and propranolol 20 mg 3 times daily.     Subjective:  HPI:  See A/p for status of chronic conditions.         Objective:  Physical Exam: BP 136/83   Pulse 64   Temp 98.1 F (36.7 C) (Temporal)   Ht 5\' 1"  (1.549 m)   Wt 142 lb 3.2 oz (64.5 kg)   SpO2 97%   BMI 26.87 kg/m   Gen: No acute distress, resting comfortably CV: Regular rate and rhythm with no murmurs appreciated Pulm: Normal work of breathing, clear to auscultation bilaterally with no crackles, wheezes, or rhonchi Neuro: Grossly normal, moves all extremities Psych: Normal affect and thought content      Katherine Barr M. , MD 12/15/2021 9:54 AM

## 2021-12-15 NOTE — Assessment & Plan Note (Signed)
Pressure at goal on irbesartan 150 mg daily and propranolol 20 mg 3 times daily.

## 2021-12-16 ENCOUNTER — Telehealth: Payer: Self-pay | Admitting: *Deleted

## 2021-12-16 NOTE — Telephone Encounter (Signed)
[id number] - Rx #: [id number] Status Sent to [facility] today Drug Mounjaro 5MG /0.5ML pen-injectors Waiting for determination

## 2021-12-17 NOTE — Telephone Encounter (Signed)
Spoke to patient and she stated that she has been waiting for 2 days and has not received lab results. Patient stated that pharmacy stated [facility] isn't covered by insurance. Informed patient that both Jimmey Ralph and Ruthine Dose are out of the office and PA has been done for medication, pending results. Patient insisted that she is going out of town tomorrow and need results and medication. Patient upset she has not heard anything from office and it has been 2 days.

## 2021-12-17 NOTE — Telephone Encounter (Signed)
Patient requests to be called at ph# [phone] re: Mounjaro. Patient states Pharmacy told her 12/17/21 that [facility] Keen is not covered by insurance.  Patient also requests to be given her lab results

## 2021-12-17 NOTE — Telephone Encounter (Signed)
N/Aon August 10 PA Case: [id number], Status: Closed, Closed Reason Code: FM Product not covered by this plan. Prior Authorization not available., Closed Rationale: [employer] Rx Prior Authorization  Patient notified Stated do not have time to talk to insurance for medication cover  Requesting medication change.

## 2021-12-20 ENCOUNTER — Ambulatory Visit: Payer: 59 | Admitting: Family Medicine

## 2021-12-20 ENCOUNTER — Other Ambulatory Visit: Payer: Self-pay | Admitting: *Deleted

## 2021-12-20 ENCOUNTER — Encounter: Payer: Self-pay | Admitting: *Deleted

## 2021-12-20 DIAGNOSIS — E1165 Type 2 diabetes mellitus with hyperglycemia: Secondary | ICD-10-CM

## 2021-12-20 MED ORDER — OZEMPIC (0.25 OR 0.5 MG/DOSE) 2 MG/3ML ~~LOC~~ SOPN: 0.2500 mg | PEN_INJECTOR | SUBCUTANEOUS | 0 refills | Status: DC

## 2021-12-20 NOTE — Progress Notes (Signed)
Please inform patient of the following:  Sugar is high but everything else is stable.Will defer further management of her diabetes to her PCP.  Katherine Barr. Jimmey Ralph, MD 12/20/2021 1:01 PM

## 2021-12-21 NOTE — Telephone Encounter (Signed)
Rx for Ozempic sent to the pharmacy. Mychart message sent informing patient.

## 2021-12-30 ENCOUNTER — Telehealth: Payer: Self-pay | Admitting: *Deleted

## 2021-12-30 NOTE — Telephone Encounter (Signed)
[facility]organs - Rx #: [id number] Status Sent to [facility] Drug Mounjaro 5MG /0.5ML pen-injectors Waiting for determination

## 2021-12-31 NOTE — Telephone Encounter (Signed)
Form, medical records and lab results faxed for approval today

## 2022-01-13 ENCOUNTER — Other Ambulatory Visit: Payer: Self-pay | Admitting: Family Medicine

## 2022-01-25 ENCOUNTER — Other Ambulatory Visit: Payer: Self-pay | Admitting: Family Medicine

## 2022-01-25 DIAGNOSIS — Z1231 Encounter for screening mammogram for malignant neoplasm of breast: Secondary | ICD-10-CM

## 2022-01-31 ENCOUNTER — Encounter: Payer: Self-pay | Admitting: *Deleted

## 2022-02-03 ENCOUNTER — Other Ambulatory Visit: Payer: Self-pay | Admitting: Family Medicine

## 2022-02-03 DIAGNOSIS — E1165 Type 2 diabetes mellitus with hyperglycemia: Secondary | ICD-10-CM

## 2022-02-04 ENCOUNTER — Other Ambulatory Visit: Payer: Self-pay | Admitting: Family Medicine

## 2022-02-04 MED ORDER — SEMAGLUTIDE(0.25 OR 0.5MG/DOS) 2 MG/3ML ~~LOC~~ SOPN: 0.5000 mg | PEN_INJECTOR | SUBCUTANEOUS | 1 refills | Status: DC

## 2022-02-04 NOTE — Telephone Encounter (Signed)
Patient stated that she is taking Ozempic, not Byetta, please send rx. Patient stated that she will call back to schedule appointment for November.

## 2022-02-14 ENCOUNTER — Other Ambulatory Visit: Payer: Self-pay | Admitting: Family Medicine

## 2022-03-07 ENCOUNTER — Ambulatory Visit
Admission: RE | Admit: 2022-03-07 | Discharge: 2022-03-07 | Disposition: A | Discharge status: Home / Self Care | Payer: Commercial Managed Care - HMO | Source: Ambulatory Visit | Attending: Family Medicine | Admitting: Family Medicine

## 2022-03-07 DIAGNOSIS — Z1231 Encounter for screening mammogram for malignant neoplasm of breast: Secondary | ICD-10-CM

## 2022-03-10 ENCOUNTER — Other Ambulatory Visit: Payer: Self-pay | Admitting: Family Medicine

## 2022-03-10 DIAGNOSIS — R928 Other abnormal and inconclusive findings on diagnostic imaging of breast: Secondary | ICD-10-CM

## 2022-03-15 LAB — HM MAMMOGRAPHY

## 2022-03-22 ENCOUNTER — Ambulatory Visit (INDEPENDENT_AMBULATORY_CARE_PROVIDER_SITE_OTHER): Payer: Commercial Managed Care - HMO | Admitting: Family Medicine

## 2022-03-22 ENCOUNTER — Other Ambulatory Visit: Payer: Self-pay | Admitting: Family Medicine

## 2022-03-22 ENCOUNTER — Encounter: Payer: Self-pay | Admitting: Family Medicine

## 2022-03-22 VITALS — BP 130/80 | HR 72 | Temp 98.4°F | Ht 61.0 in | Wt 146.2 lb

## 2022-03-22 DIAGNOSIS — E1165 Type 2 diabetes mellitus with hyperglycemia: Secondary | ICD-10-CM | POA: Diagnosis not present

## 2022-03-22 DIAGNOSIS — E1169 Type 2 diabetes mellitus with other specified complication: Secondary | ICD-10-CM | POA: Diagnosis not present

## 2022-03-22 DIAGNOSIS — I152 Hypertension secondary to endocrine disorders: Secondary | ICD-10-CM

## 2022-03-22 DIAGNOSIS — E1159 Type 2 diabetes mellitus with other circulatory complications: Secondary | ICD-10-CM

## 2022-03-22 DIAGNOSIS — E785 Hyperlipidemia, unspecified: Secondary | ICD-10-CM

## 2022-03-22 DIAGNOSIS — Z1211 Encounter for screening for malignant neoplasm of colon: Secondary | ICD-10-CM | POA: Diagnosis not present

## 2022-03-22 MED ORDER — FREESTYLE LIBRE 14 DAY SENSOR MISC: 1.0000 | 3 refills | Status: AC

## 2022-03-22 MED ORDER — GLIPIZIDE 5 MG PO TABS: 5.0000 mg | ORAL_TABLET | Freq: Every day | ORAL | 1 refills | Status: AC

## 2022-03-22 MED ORDER — FREESTYLE LIBRE 14 DAY READER DEVI: 1.0000 | Freq: Once | 1 refills | Status: AC

## 2022-03-22 MED ORDER — SEMAGLUTIDE(0.25 OR 0.5MG/DOS) 2 MG/3ML ~~LOC~~ SOPN: 0.5000 mg | PEN_INJECTOR | SUBCUTANEOUS | 1 refills | Status: DC

## 2022-03-22 NOTE — Progress Notes (Signed)
Subjective:     Patient ID: Katherine Barr, female    DOB: 05-05-1976, 46 y.o.   MRN: [id number]  Chief Complaint  Patient presents with   Follow-up    3 month follow-up     HPI  DM type 2-doesn't want Byetta. Trulicity-SE.  Ins not cover mounjaro.  Went to get ozempic and "pharmacy not have".  Taking glipizide 5mg  in am and metfromin 1000 bid.  3 wks ago-140.  UTI from SGLT-2. Has not been exercising HTN-Pt is on avapro 150 and prpranolol tid .  Bp's running 135-140/80-90.  No ha/dizziness/cp/palp/edema/cough/sob  HLD-atorvastatin and fenofibrate-tolerating well  Health Maintenance Due  Topic Date Due   Hepatitis C Screening  Never done   FOOT EXAM  09/07/2017   OPHTHALMOLOGY EXAM  12/15/2018    Past Medical History:  Diagnosis Date   Acid indigestion 09/07/2016   Anemia 10/24/2016   DM Type 2    GERD (gastroesophageal reflux disease)    Headache    History of kidney stones    HTN (hypertension)    Hyperlipidemia    Hypertension    Wears glasses     Past Surgical History:  Procedure Laterality Date   BREAST BIOPSY Left 2014   BENIGN   CESAREAN SECTION WITH BILATERAL TUBAL LIGATION  2015   HYSTEROSCOPY WITH D & C N/A 01/18/2021   Procedure: DILATATION AND CURETTAGE /HYSTEROSCOPY WITH MYOSURE;  Surgeon: 03/20/2021, MD;  Location: [facility];  Service: Gynecology;  Laterality: N/A;   INTRAUTERINE DEVICE (IUD) INSERTION N/A 01/18/2021   Procedure: Mirena INTRAUTERINE DEVICE (IUD) INSERTION;  Surgeon: 03/20/2021, MD;  Location: [facility];  Service: Gynecology;  Laterality: N/A;    Outpatient Medications Prior to Visit  Medication Sig Dispense Refill   atorvastatin (LIPITOR) 10 MG tablet Take 1 tablet (10 mg total) by mouth every evening. 90 tablet 3   B Complex Vitamins (VITAMIN B COMPLEX) TABS Take 1 tablet by mouth daily.     fenofibrate 160 MG tablet Take 1 tablet (160 mg total) by mouth every evening. 90  tablet 3   irbesartan (AVAPRO) 150 MG tablet Take 1 tablet (150 mg total) by mouth daily. 90 tablet 3   MEGARED OMEGA-3 KRILL OIL 500 MG CAPS Take 1 capsule by mouth daily. 30 capsule 5   metFORMIN (GLUCOPHAGE-XR) 500 MG 24 hr tablet Take 2 tablets (1,000 mg total) by mouth 2 (two) times daily. 360 tablet 3   propranolol (INDERAL) 20 MG tablet Take 1 tablet (20 mg total) by mouth 3 (three) times daily. 270 tablet 3   glipiZIDE (GLUCOTROL) 5 MG tablet Take 1 tablet (5 mg total) by mouth daily before breakfast. 90 tablet 1   Semaglutide,0.25 or 0.5MG /DOS, 2 MG/3ML SOPN Inject 0.5 mg into the skin once a week. (Patient not taking: Reported on 03/22/2022) 3 mL 1   No facility-administered medications prior to visit.    Allergies  Allergen Reactions   Amlodipine Swelling   Lisinopril Cough   ROS neg/noncontributory except as noted HPI/below      Objective:     BP 130/80   Pulse 72   Temp 98.4 F (36.9 C) (Temporal)   Ht 5\' 1"  (1.549 m)   Wt 146 lb 4 oz (66.3 kg)   SpO2 100%   BMI 27.63 kg/m  Wt Readings from Last 3 Encounters:  03/22/22 146 lb 4 oz (66.3 kg)  12/15/21 142 lb 3.2 oz (64.5 kg)  09/20/21  144 lb 2 oz (65.4 kg)    Physical Exam   Gen: WDWN NAD HEENT: NCAT, conjunctiva not injected, sclera nonicteric NECK:  supple, no thyromegaly, no nodes, no carotid bruits CARDIAC: RRR, S1S2+, no murmur. DP 2+B LUNGS: CTAB. No wheezes ABDOMEN:  BS+, soft, NTND, No HSM, no masses EXT:  no edema MSK: no gross abnormalities.  NEURO: A&O x3.  CN II-XII intact.  PSYCH: normal mood. Good eye contact     Assessment & Plan:   Problem List Items Addressed This Visit       Cardiovascular and Mediastinum   Hypertension associated with diabetes (HCC)   Relevant Medications   Semaglutide,0.25 or 0.5MG /DOS, 2 MG/3ML SOPN   glipiZIDE (GLUCOTROL) 5 MG tablet     Endocrine   Dyslipidemia associated with type 2 diabetes mellitus (HCC)   Relevant Medications   Semaglutide,0.25  or 0.5MG /DOS, 2 MG/3ML SOPN   glipiZIDE (GLUCOTROL) 5 MG tablet   Type 2 diabetes mellitus with hyperglycemia (HCC) - Primary   Relevant Medications   Semaglutide,0.25 or 0.5MG /DOS, 2 MG/3ML SOPN   glipiZIDE (GLUCOTROL) 5 MG tablet   Other Relevant Orders   Comprehensive metabolic panel   Hemoglobin A1c   Lipid panel   Other Visit Diagnoses     Screen for colon cancer       Relevant Orders   Cologuard     1.  Type 2 diabetes with hyperglycemia-chronic.  Uncontrolled.  Patient has not been exercising (long days at work at now dark outside).  Advised needs to try to make exercise part of her routine, even if short times several times a day.  She has been intolerant of SGLT2's.  Intolerant of Trulicity, prefers not to take Byetta, insurance not cover [place], and states she cannot get Ozempic.  Advised to check with pharmacy what problem is with the Ozempic.  Continue glipizide 5 mg daily-May need to increase to twice daily.  Continue metformin at 1000 mg twice daily.  Check CMP, A1c, lipids.  Patient requesting freestyle libre.  Advised to schedule eye exam when due in a couple of months.  Follow-up in 3 months 2.  Hypertension-chronic.  Well-controlled.  Continue irbesartan 150 mg daily and propranolol 20 mg 3 times daily. 3.  Mixed hyperlipidemia chronic.  Unsure of control.  Will check lipids.  Continue omega-3's, fenofibrate 160 mg daily and atorvastatin 10 mg daily. 4.  Screening colon ca-declined cscope.  Agreed to cologard.  Meds ordered this encounter  Medications   Semaglutide,0.25 or 0.5MG /DOS, 2 MG/3ML SOPN    Sig: Inject 0.5 mg into the skin once a week.    Dispense:  3 mL    Refill:  1   glipiZIDE (GLUCOTROL) 5 MG tablet    Sig: Take 1 tablet (5 mg total) by mouth daily before breakfast.    Dispense:  90 tablet    Refill:  1   Continuous Blood Gluc Receiver (FREESTYLE LIBRE 14 DAY READER) DEVI    Sig: 1 each by Does not apply route once for 1 dose.    Dispense:  1 each     Refill:  1   Continuous Blood Gluc Sensor (FREESTYLE LIBRE 14 DAY SENSOR) MISC    Sig: 1 each by Does not apply route every 14 (fourteen) days.    Dispense:  6 each    Refill:  3    Angelena Sole, MD

## 2022-03-22 NOTE — Patient Instructions (Signed)
It was very nice to see you today!  Get exercise   PLEASE NOTE:  If you had any lab tests please let us know if you have not heard back within a few days. You may see your results on MyChart before we have a chance to review them but we will give you a call once they are reviewed by [place]. If we ordered any referrals today, please let us know if you have not heard from their office within the next week.   Please try these tips to maintain a healthy lifestyle:  Eat most of your calories during the day when you are active. Eliminate processed foods including packaged sweets (pies, cakes, cookies), reduce intake of potatoes, white bread, white pasta, and white rice. Look for whole grain options, oat flour or almond flour.  Each meal should contain half fruits/vegetables, one quarter protein, and one quarter carbs (no bigger than a computer mouse).  Cut down on sweet beverages. This includes juice, soda, and sweet tea. Also watch fruit intake, though this is a healthier sweet option, it still contains natural sugar! Limit to 3 servings daily.  Drink at least 1 glass of water with each meal and aim for at least 8 glasses per day  Exercise at least 150 minutes every week.

## 2022-03-22 NOTE — Telephone Encounter (Signed)
Pt refuses

## 2022-03-23 ENCOUNTER — Ambulatory Visit
Admission: RE | Admit: 2022-03-23 | Discharge: 2022-03-23 | Disposition: A | Discharge status: Home / Self Care | Payer: Commercial Managed Care - HMO | Source: Ambulatory Visit | Attending: Family Medicine | Admitting: Family Medicine

## 2022-03-23 DIAGNOSIS — R928 Other abnormal and inconclusive findings on diagnostic imaging of breast: Secondary | ICD-10-CM

## 2022-03-23 LAB — LIPID PANEL
Cholesterol: 102 mg/dL (ref 0–200)
HDL: 26.3 mg/dL — ABNORMAL LOW (ref >39.00)
LDL Cholesterol: 57 mg/dL (ref 0–99)
NonHDL: 75.79
Total CHOL/HDL Ratio: 4
Triglycerides: 92 mg/dL (ref 0.0–149.0)
VLDL: 18.4 mg/dL (ref 0.0–40.0)

## 2022-03-23 LAB — COMPREHENSIVE METABOLIC PANEL
ALT: 16 U/L (ref 0–35)
AST: 16 U/L (ref 0–37)
Albumin: 4.2 g/dL (ref 3.5–5.2)
Alkaline Phosphatase: 46 U/L (ref 39–117)
BUN: 9 mg/dL (ref 6–23)
CO2: 25 mEq/L (ref 19–32)
Calcium: 9.6 mg/dL (ref 8.4–10.5)
Chloride: 103 mEq/L (ref 96–112)
Creatinine, Ser: 0.62 mg/dL (ref 0.40–1.20)
GFR: 107.37 mL/min (ref >60.00)
Glucose, Bld: 92 mg/dL (ref 70–99)
Potassium: 4.1 mEq/L (ref 3.5–5.1)
Sodium: 136 mEq/L (ref 135–145)
Total Bilirubin: 0.3 mg/dL (ref 0.2–1.2)
Total Protein: 7 g/dL (ref 6.0–8.3)

## 2022-03-23 LAB — HM MAMMOGRAPHY

## 2022-03-23 LAB — HEMOGLOBIN A1C: Hgb A1c MFr Bld: 9.6 % — ABNORMAL HIGH (ref 4.6–6.5)

## 2022-03-23 NOTE — Progress Notes (Signed)
A1C is better.  Still long way to go.  Since sugar was 92 day labs done, I'm hesitant to change the glipizide.  Has she gotten the [place]?   Need to see what sugars are running.

## 2022-03-24 ENCOUNTER — Encounter: Payer: Self-pay | Admitting: Family Medicine

## 2022-03-25 ENCOUNTER — Encounter: Payer: Self-pay | Admitting: *Deleted

## 2022-03-25 ENCOUNTER — Other Ambulatory Visit: Payer: Self-pay | Admitting: Family Medicine

## 2022-03-28 ENCOUNTER — Other Ambulatory Visit: Payer: Self-pay | Admitting: Family Medicine

## 2022-03-28 MED ORDER — SEMAGLUTIDE(0.25 OR 0.5MG/DOS) 2 MG/3ML ~~LOC~~ SOPN: 0.5000 mg | PEN_INJECTOR | SUBCUTANEOUS | 1 refills | Status: AC

## 2022-04-27 ENCOUNTER — Other Ambulatory Visit (INDEPENDENT_AMBULATORY_CARE_PROVIDER_SITE_OTHER): Payer: Commercial Managed Care - HMO | Admitting: Family Medicine

## 2022-04-27 ENCOUNTER — Telehealth: Payer: Commercial Managed Care - HMO | Admitting: Internal Medicine

## 2022-04-27 DIAGNOSIS — R059 Cough, unspecified: Secondary | ICD-10-CM | POA: Diagnosis not present

## 2022-04-27 DIAGNOSIS — J029 Acute pharyngitis, unspecified: Secondary | ICD-10-CM

## 2022-04-27 LAB — POCT INFLUENZA A/B
Influenza A, POC: NEGATIVE
Influenza B, POC: NEGATIVE

## 2022-04-27 LAB — POCT RAPID STREP A (OFFICE): Rapid Strep A Screen: NEGATIVE

## 2022-04-27 LAB — POC COVID19 BINAXNOW: SARS Coronavirus 2 Ag: NEGATIVE

## 2022-04-28 ENCOUNTER — Ambulatory Visit (INDEPENDENT_AMBULATORY_CARE_PROVIDER_SITE_OTHER): Payer: Commercial Managed Care - HMO | Admitting: Internal Medicine

## 2022-04-28 ENCOUNTER — Encounter: Payer: Self-pay | Admitting: Internal Medicine

## 2022-04-28 VITALS — BP 130/80 | HR 85 | Temp 99.9°F | Wt 143.3 lb

## 2022-04-28 DIAGNOSIS — J069 Acute upper respiratory infection, unspecified: Secondary | ICD-10-CM | POA: Diagnosis not present

## 2022-04-28 MED ORDER — BENZONATATE 100 MG PO CAPS: 100.0000 mg | ORAL_CAPSULE | Freq: Two times a day (BID) | ORAL | 0 refills | Status: AC | PRN

## 2022-04-28 NOTE — Progress Notes (Signed)
Acute office Visit     CC/Reason for Visit: URI  HPI: Katherine Barr is a 46 y.o. female who is coming in today for the above mentioned reasons.  For the past 4 days has been having cough, drainage and fatigue.  No recent travel or sick contacts.   Past Medical/Surgical History: Past Medical History:  Diagnosis Date   Acid indigestion 09/07/2016   Anemia 10/24/2016   DM Type 2    GERD (gastroesophageal reflux disease)    Headache    History of kidney stones    HTN (hypertension)    Hyperlipidemia    Hypertension    Wears glasses     Past Surgical History:  Procedure Laterality Date   BREAST BIOPSY Left 2014   BENIGN   CESAREAN SECTION WITH BILATERAL TUBAL LIGATION  2015   HYSTEROSCOPY WITH D & C N/A 01/18/2021   Procedure: DILATATION AND CURETTAGE /HYSTEROSCOPY WITH MYOSURE;  Surgeon: Romualdo Bolk, MD;  Location: [facility];  Service: Gynecology;  Laterality: N/A;   INTRAUTERINE DEVICE (IUD) INSERTION N/A 01/18/2021   Procedure: Mirena INTRAUTERINE DEVICE (IUD) INSERTION;  Surgeon: Romualdo Bolk, MD;  Location: [facility];  Service: Gynecology;  Laterality: N/A;    Social History:  reports that she has never smoked. She has never used smokeless tobacco. She reports that she does not drink alcohol and does not use drugs.  Allergies: Allergies  Allergen Reactions   Amlodipine Swelling   Lisinopril Cough    Family History:  Family History  Problem Relation Age of Onset   Diabetes Mother    Hypertension Father    Diabetes Brother    Hypertension Mother    Diabetes Brother    Stroke Brother      Current Outpatient Medications:    atorvastatin (LIPITOR) 10 MG tablet, Take 1 tablet (10 mg total) by mouth every evening., Disp: 90 tablet, Rfl: 3   B Complex Vitamins (VITAMIN B COMPLEX) TABS, Take 1 tablet by mouth daily., Disp: , Rfl:    benzonatate (TESSALON) 100 MG capsule, Take 1 capsule (100 mg total) by  mouth 2 (two) times daily as needed for cough., Disp: 20 capsule, Rfl: 0   Continuous Blood Gluc Sensor (FREESTYLE LIBRE 14 DAY SENSOR) MISC, 1 each by Does not apply route every 14 (fourteen) days., Disp: 6 each, Rfl: 3   fenofibrate 160 MG tablet, Take 1 tablet (160 mg total) by mouth every evening., Disp: 90 tablet, Rfl: 3   glipiZIDE (GLUCOTROL) 5 MG tablet, Take 1 tablet (5 mg total) by mouth daily before breakfast., Disp: 90 tablet, Rfl: 1   irbesartan (AVAPRO) 150 MG tablet, Take 1 tablet (150 mg total) by mouth daily., Disp: 90 tablet, Rfl: 3   MEGARED OMEGA-3 KRILL OIL 500 MG CAPS, Take 1 capsule by mouth daily., Disp: 30 capsule, Rfl: 5   metFORMIN (GLUCOPHAGE-XR) 500 MG 24 hr tablet, Take 2 tablets (1,000 mg total) by mouth 2 (two) times daily., Disp: 360 tablet, Rfl: 3   propranolol (INDERAL) 20 MG tablet, Take 1 tablet (20 mg total) by mouth 3 (three) times daily., Disp: 270 tablet, Rfl: 3   Semaglutide,0.25 or 0.5MG /DOS, 2 MG/3ML SOPN, Inject 0.5 mg into the skin once a week. (Patient not taking: Reported on 04/28/2022), Disp: 3 mL, Rfl: 1  Review of Systems:  Constitutional: Denies fever, chills, diaphoresis, appetite change.  HEENT: Denies photophobia, eye pain, redness,  mouth sores, trouble swallowing, neck pain, neck  stiffness and tinnitus.   Respiratory: Denies SOB, DOE, chest tightness,  and wheezing.   Cardiovascular: Denies chest pain, palpitations and leg swelling.  Gastrointestinal: Denies nausea, vomiting, abdominal pain, diarrhea, constipation, blood in stool and abdominal distention.  Genitourinary: Denies dysuria, urgency, frequency, hematuria, flank pain and difficulty urinating.  Endocrine: Denies: hot or cold intolerance, sweats, changes in hair or nails, polyuria, polydipsia. Musculoskeletal: Denies myalgias, back pain, joint swelling, arthralgias and gait problem.  Skin: Denies pallor, rash and wound.  Neurological: Denies dizziness, seizures, syncope,   light-headedness, numbness and headaches.  Hematological: Denies adenopathy. Easy bruising, personal or family bleeding history  Psychiatric/Behavioral: Denies suicidal ideation, mood changes, confusion, nervousness, sleep disturbance and agitation    Physical Exam: Vitals:   04/28/22 1304  BP: 130/80  Pulse: 85  Temp: 99.9 F (37.7 C)  TempSrc: Oral  SpO2: 97%  Weight: 143 lb 4.8 oz (65 kg)    Body mass index is 27.08 kg/m.   Constitutional: NAD, calm, comfortable Eyes: PERRL, lids and conjunctivae normal ENMT: Mucous membranes are moist. Posterior pharynx is erythematous but clear of any exudate or lesions.  Tympanic membrane is pearly white, no erythema or bulging. Respiratory: clear to auscultation bilaterally, no wheezing, no crackles. Normal respiratory effort. No accessory muscle use.  Cardiovascular: Regular rate and rhythm, no murmurs / rubs / gallops. No extremity edema.   Impression and Plan:  Viral URI with cough - Plan: benzonatate (TESSALON) 100 MG capsule  -In office flu and COVID test are negative. -Given exam findings, PNA, pharyngitis, ear infection are not likely, hence abx have not been prescribed. -Have advised rest, fluids, OTC antihistamines, cough suppressants and mucinex. -RTC if no improvement in 10-14 days.   Time spent:23 minutes reviewing chart, interviewing and examining patient and formulating plan of care.     Katherine Jan, MD [facility] [place]

## 2022-08-04 ENCOUNTER — Other Ambulatory Visit: Payer: Self-pay | Admitting: Family Medicine

## 2022-11-02 ENCOUNTER — Other Ambulatory Visit: Payer: Self-pay | Admitting: Family Medicine

## 2023-02-14 ENCOUNTER — Other Ambulatory Visit: Payer: Self-pay | Admitting: Family Medicine

## 2023-02-16 ENCOUNTER — Other Ambulatory Visit: Payer: Self-pay | Admitting: Family Medicine

## 2023-02-28 ENCOUNTER — Other Ambulatory Visit: Payer: Self-pay | Admitting: Diagnostic Radiology

## 2023-02-28 DIAGNOSIS — Z1231 Encounter for screening mammogram for malignant neoplasm of breast: Secondary | ICD-10-CM

## 2023-03-22 ENCOUNTER — Other Ambulatory Visit: Payer: Self-pay | Admitting: Family Medicine

## 2023-03-22 DIAGNOSIS — Z760 Encounter for issue of repeat prescription: Secondary | ICD-10-CM

## 2023-03-27 ENCOUNTER — Other Ambulatory Visit: Payer: Self-pay | Admitting: Family Medicine

## 2023-03-27 ENCOUNTER — Other Ambulatory Visit: Payer: Commercial Managed Care - HMO

## 2023-03-27 ENCOUNTER — Ambulatory Visit
Admission: RE | Admit: 2023-03-27 | Discharge: 2023-03-27 | Disposition: A | Discharge status: Home / Self Care | Payer: Medicaid Other | Source: Ambulatory Visit | Attending: Diagnostic Radiology | Admitting: Diagnostic Radiology

## 2023-03-27 DIAGNOSIS — Z1231 Encounter for screening mammogram for malignant neoplasm of breast: Secondary | ICD-10-CM

## 2023-06-20 ENCOUNTER — Other Ambulatory Visit: Payer: Self-pay | Admitting: Family Medicine

## 2023-06-20 DIAGNOSIS — Z0000 Encounter for general adult medical examination without abnormal findings: Secondary | ICD-10-CM

## 2023-06-29 ENCOUNTER — Encounter: Payer: 59 | Admitting: Family Medicine

## 2023-07-11 ENCOUNTER — Ambulatory Visit: Payer: 59 | Admitting: Family Medicine

## 2024-03-26 ENCOUNTER — Other Ambulatory Visit: Payer: Self-pay | Admitting: Family Medicine

## 2024-03-26 DIAGNOSIS — Z1231 Encounter for screening mammogram for malignant neoplasm of breast: Secondary | ICD-10-CM

## 2024-04-23 ENCOUNTER — Ambulatory Visit
Admission: RE | Admit: 2024-04-23 | Discharge: 2024-04-23 | Disposition: A | Source: Ambulatory Visit | Attending: Family Medicine | Admitting: Family Medicine

## 2024-04-23 DIAGNOSIS — Z1231 Encounter for screening mammogram for malignant neoplasm of breast: Secondary | ICD-10-CM
